# Patient Record
Sex: Male | Born: 1945 | Race: White | Hispanic: No | Marital: Married
Health system: Southern US, Community
[De-identification: ages and names within clinical notes are randomized; demographics above are authoritative.]

## PROBLEM LIST (undated history)

## (undated) DIAGNOSIS — I10 Essential (primary) hypertension: Secondary | ICD-10-CM

## (undated) DIAGNOSIS — G4730 Sleep apnea, unspecified: Secondary | ICD-10-CM

## (undated) DIAGNOSIS — H269 Unspecified cataract: Secondary | ICD-10-CM

## (undated) DIAGNOSIS — N189 Chronic kidney disease, unspecified: Secondary | ICD-10-CM

## (undated) HISTORY — DX: Chronic kidney disease, unspecified: N18.9

## (undated) HISTORY — DX: Unspecified cataract: H26.9

## (undated) HISTORY — DX: Essential (primary) hypertension: I10

## (undated) HISTORY — PX: NO PAST SURGERIES: SHX2092

---

## 2004-08-21 ENCOUNTER — Emergency Department (HOSPITAL_COMMUNITY): Admission: EM | Admit: 2004-08-21 | Discharge: 2004-08-21 | Payer: Self-pay | Admitting: Emergency Medicine

## 2006-01-29 ENCOUNTER — Encounter (HOSPITAL_COMMUNITY): Admission: RE | Admit: 2006-01-29 | Discharge: 2006-02-28 | Payer: Self-pay | Admitting: Oncology

## 2006-01-29 ENCOUNTER — Ambulatory Visit (HOSPITAL_COMMUNITY): Payer: Self-pay | Admitting: Oncology

## 2006-01-29 ENCOUNTER — Encounter: Admission: RE | Admit: 2006-01-29 | Discharge: 2006-01-29 | Payer: Self-pay | Admitting: Oncology

## 2007-02-19 ENCOUNTER — Ambulatory Visit (HOSPITAL_COMMUNITY): Admission: RE | Admit: 2007-02-19 | Discharge: 2007-02-19 | Payer: Self-pay | Admitting: Family Medicine

## 2009-02-24 ENCOUNTER — Ambulatory Visit (HOSPITAL_COMMUNITY): Admission: RE | Admit: 2009-02-24 | Discharge: 2009-02-24 | Payer: Self-pay | Admitting: Family Medicine

## 2009-02-25 ENCOUNTER — Ambulatory Visit (HOSPITAL_COMMUNITY): Admission: RE | Admit: 2009-02-25 | Discharge: 2009-02-25 | Payer: Self-pay | Admitting: Family Medicine

## 2009-07-13 ENCOUNTER — Ambulatory Visit (HOSPITAL_COMMUNITY): Admission: RE | Admit: 2009-07-13 | Discharge: 2009-07-13 | Payer: Self-pay | Admitting: Family Medicine

## 2010-05-10 ENCOUNTER — Ambulatory Visit (HOSPITAL_COMMUNITY): Admission: RE | Admit: 2010-05-10 | Discharge: 2010-05-10 | Payer: Self-pay | Admitting: Family Medicine

## 2012-04-15 ENCOUNTER — Encounter (HOSPITAL_COMMUNITY): Payer: Self-pay | Admitting: Pharmacy Technician

## 2012-04-15 NOTE — Patient Instructions (Addendum)
20 MICHAIAH HOLSOPPLE  04/15/2012   Your procedure is scheduled on:  04/22/2012  Report to [facility] at  615  AM.  Call this number if you have problems the morning of surgery: [phone]   Remember:   Do not eat food:After Midnight.  May have clear liquids:until Midnight .  Clear liquids include soda, tea, black coffee, apple or grape juice, broth.  Take these medicines the morning of surgery with A SIP OF WATER: metoprolol and cardura   Do not wear jewelry, make-up or nail polish.  Do not wear lotions, powders, or perfumes. You may wear deodorant.  Do not shave 48 hours prior to surgery.  Do not bring valuables to the hospital.  Contacts, dentures or bridgework may not be worn into surgery.  Leave suitcase in the car. After surgery it may be brought to your room.  For patients admitted to the hospital, checkout time is 11:00 AM the day of discharge.   Patients discharged the day of surgery will not be allowed to drive home.  Name and phone number of your driver: family  Special Instructions: Use eye drops as instructed by pharmacy.   Please read over the following fact sheets that you were given: Pain Booklet, Surgical Site Infection Prevention, Anesthesia Post-op Instructions and Care and Recovery After Surgery   Cataract A cataract is a clouding of the lens of the eye. When a lens becomes cloudy, vision is reduced based on the degree and nature of the clouding. Many cataracts reduce vision to some degree. Some cataracts make people more near-sighted as they develop. Other cataracts increase glare. Cataracts that are ignored and become worse can sometimes look white. The white color can be seen through the pupil. CAUSES   Aging. However, cataracts may occur at any age, even in newborns.   Certain drugs.   Trauma to the eye.   Certain diseases such as diabetes.   Specific eye diseases such as chronic inflammation inside the eye or a sudden attack of a rare form of glaucoma.    Inherited or acquired medical problems.  SYMPTOMS   Gradual, progressive drop in vision in the affected eye.   Severe, rapid visual loss. This most often happens when trauma is the cause.  DIAGNOSIS  To detect a cataract, an eye doctor examines the lens. Cataracts are best diagnosed with an exam of the eyes with the pupils enlarged (dilated) by drops.  TREATMENT  For an early cataract, vision may improve by using different eyeglasses or stronger lighting. If that does not help your vision, surgery is the only effective treatment. A cataract needs to be surgically removed when vision loss interferes with your everyday activities, such as driving, reading, or watching TV. A cataract may also have to be removed if it prevents examination or treatment of another eye problem. Surgery removes the cloudy lens and usually replaces it with a substitute lens (intraocular lens, IOL).  At a time when both you and your doctor agree, the cataract will be surgically removed. If you have cataracts in both eyes, only one is usually removed at a time. This allows the operated eye to heal and be out of danger from any possible problems after surgery (such as infection or poor wound healing). In rare cases, a cataract may be doing damage to your eye. In these cases, your caregiver may advise surgical removal right away. The vast majority of people who have cataract surgery have better vision afterward. HOME CARE INSTRUCTIONS  If you are not planning surgery, you may be asked to do the following:  Use different eyeglasses.   Use stronger or brighter lighting.   Ask your eye doctor about reducing your medicine dose or changing medicines if it is thought that a medicine caused your cataract. Changing medicines does not make the cataract go away on its own.   Become familiar with your surroundings. Poor vision can lead to injury. Avoid bumping into things on the affected side. You are at a higher risk for tripping  or falling.   Exercise extreme care when driving or operating machinery.   Wear sunglasses if you are sensitive to bright light or experiencing problems with glare.  SEEK IMMEDIATE MEDICAL CARE IF:   You have a worsening or sudden vision loss.   You notice redness, swelling, or increasing pain in the eye.   You have a fever.  Document Released: 12/11/2005 Document Revised: 11/30/2011 Document Reviewed: 08/04/2011 [facility] Patient Information 2012 [place], [place].PATIENT INSTRUCTIONS POST-ANESTHESIA  IMMEDIATELY FOLLOWING SURGERY:  Do not drive or operate machinery for the first twenty four hours after surgery.  Do not make any important decisions for twenty four hours after surgery or while taking narcotic pain medications or sedatives.  If you develop intractable nausea and vomiting or a severe headache please notify your doctor immediately.  FOLLOW-UP:  Please make an appointment with your surgeon as instructed. You do not need to follow up with anesthesia unless specifically instructed to do so.  WOUND CARE INSTRUCTIONS (if applicable):  Keep a dry clean dressing on the anesthesia/puncture wound site if there is drainage.  Once the wound has quit draining you may leave it open to air.  Generally you should leave the bandage intact for twenty four hours unless there is drainage.  If the epidural site drains for more than 36-48 hours please call the anesthesia department.  QUESTIONS?:  Please feel free to call your physician or the hospital operator if you have any questions, and they will be happy to assist you.

## 2012-04-16 ENCOUNTER — Encounter (HOSPITAL_COMMUNITY): Payer: Self-pay

## 2012-04-16 ENCOUNTER — Other Ambulatory Visit: Payer: Self-pay

## 2012-04-16 ENCOUNTER — Encounter (HOSPITAL_COMMUNITY)
Admission: RE | Admit: 2012-04-16 | Discharge: 2012-04-16 | Disposition: A | Discharge status: Home / Self Care | Payer: Medicare Other | Source: Ambulatory Visit | Attending: Ophthalmology | Admitting: Ophthalmology

## 2012-04-16 HISTORY — DX: Sleep apnea, unspecified: G47.30

## 2012-04-16 HISTORY — DX: Essential (primary) hypertension: I10

## 2012-04-16 LAB — BASIC METABOLIC PANEL WITH GFR
BUN: 23 mg/dL (ref 6–23)
CO2: 30 meq/L (ref 19–32)
Calcium: 9.5 mg/dL (ref 8.4–10.5)
Chloride: 103 meq/L (ref 96–112)
Creatinine, Ser: 1.35 mg/dL (ref 0.50–1.35)
GFR calc Af Amer: 62 mL/min — ABNORMAL LOW
GFR calc non Af Amer: 54 mL/min — ABNORMAL LOW
Glucose, Bld: 99 mg/dL (ref 70–99)
Potassium: 4.1 meq/L (ref 3.5–5.1)
Sodium: 140 meq/L (ref 135–145)

## 2012-04-16 LAB — HEMOGLOBIN AND HEMATOCRIT, BLOOD
HCT: 41.5 % (ref 39.0–52.0)
Hemoglobin: 13.8 g/dL (ref 13.0–17.0)

## 2012-04-16 MED ORDER — CYCLOPENTOLATE-PHENYLEPHRINE 0.2-1 % OP SOLN
OPHTHALMIC | Status: AC
  Filled 2012-04-16: qty 2

## 2012-04-16 NOTE — Progress Notes (Signed)
04/16/12 0951  OBSTRUCTIVE SLEEP APNEA  Score 4 or greater  Updated health history

## 2012-04-22 ENCOUNTER — Ambulatory Visit (HOSPITAL_COMMUNITY): Payer: Medicare Other | Admitting: Anesthesiology

## 2012-04-22 ENCOUNTER — Encounter (HOSPITAL_COMMUNITY): Payer: Self-pay | Admitting: Anesthesiology

## 2012-04-22 ENCOUNTER — Ambulatory Visit (HOSPITAL_COMMUNITY)
Admission: RE | Admit: 2012-04-22 | Discharge: 2012-04-22 | Disposition: A | Discharge status: Home / Self Care | Payer: Medicare Other | Source: Ambulatory Visit | Attending: Ophthalmology | Admitting: Ophthalmology

## 2012-04-22 ENCOUNTER — Encounter (HOSPITAL_COMMUNITY): Payer: Self-pay | Admitting: *Deleted

## 2012-04-22 ENCOUNTER — Encounter (HOSPITAL_COMMUNITY)
Admission: RE | Disposition: A | Discharge status: Home / Self Care | Payer: Self-pay | Source: Ambulatory Visit | Attending: Ophthalmology

## 2012-04-22 DIAGNOSIS — Z01810 Encounter for preprocedural cardiovascular examination: Secondary | ICD-10-CM | POA: Insufficient documentation

## 2012-04-22 DIAGNOSIS — Z01812 Encounter for preprocedural laboratory examination: Secondary | ICD-10-CM | POA: Insufficient documentation

## 2012-04-22 DIAGNOSIS — I10 Essential (primary) hypertension: Secondary | ICD-10-CM | POA: Insufficient documentation

## 2012-04-22 DIAGNOSIS — G4733 Obstructive sleep apnea (adult) (pediatric): Secondary | ICD-10-CM | POA: Insufficient documentation

## 2012-04-22 DIAGNOSIS — Z79899 Other long term (current) drug therapy: Secondary | ICD-10-CM | POA: Insufficient documentation

## 2012-04-22 DIAGNOSIS — H2510 Age-related nuclear cataract, unspecified eye: Secondary | ICD-10-CM | POA: Insufficient documentation

## 2012-04-22 HISTORY — PX: CATARACT EXTRACTION W/PHACO: SHX586

## 2012-04-22 SURGERY — PHACOEMULSIFICATION, CATARACT, WITH IOL INSERTION
Anesthesia: Monitor Anesthesia Care | Site: Eye | Laterality: Left | Wound class: Clean

## 2012-04-22 MED ORDER — GATIFLOXACIN 0.5 % OP SOLN OPTIME - NO CHARGE
OPHTHALMIC | Status: DC | PRN
  Administered 2012-04-22: 1 [drp] via OPHTHALMIC

## 2012-04-22 MED ORDER — TETRACAINE HCL 0.5 % OP SOLN
OPHTHALMIC | Status: AC
  Administered 2012-04-22: 1 [drp] via OPHTHALMIC
  Filled 2012-04-22: qty 2

## 2012-04-22 MED ORDER — CYCLOPENTOLATE-PHENYLEPHRINE 0.2-1 % OP SOLN
1.0000 [drp] | Freq: Once | OPHTHALMIC | Status: AC
  Administered 2012-04-22: 1 [drp] via OPHTHALMIC

## 2012-04-22 MED ORDER — BSS IO SOLN
INTRAOCULAR | Status: DC | PRN
  Administered 2012-04-22: 15 mL via INTRAOCULAR

## 2012-04-22 MED ORDER — GATIFLOXACIN 0.5 % OP SOLN
1.0000 [drp] | Freq: Once | OPHTHALMIC | Status: AC
  Administered 2012-04-22: 1 [drp] via OPHTHALMIC

## 2012-04-22 MED ORDER — TETRACAINE 0.5 % OP SOLN OPTIME - NO CHARGE
OPHTHALMIC | Status: DC | PRN
  Administered 2012-04-22: 1 [drp] via OPHTHALMIC

## 2012-04-22 MED ORDER — EPINEPHRINE HCL 1 MG/ML IJ SOLN
INTRAOCULAR | Status: DC | PRN
  Administered 2012-04-22: 08:00:00

## 2012-04-22 MED ORDER — LIDOCAINE HCL 3.5 % OP GEL
OPHTHALMIC | Status: AC
  Filled 2012-04-22: qty 5

## 2012-04-22 MED ORDER — MIDAZOLAM HCL 2 MG/2ML IJ SOLN
INTRAMUSCULAR | Status: AC
  Administered 2012-04-22: 1 mg via INTRAVENOUS
  Filled 2012-04-22: qty 2

## 2012-04-22 MED ORDER — TRYPAN BLUE 0.06 % OP SOLN
OPHTHALMIC | Status: AC
  Filled 2012-04-22: qty 0.5

## 2012-04-22 MED ORDER — TETRACAINE HCL 0.5 % OP SOLN
1.0000 [drp] | Freq: Once | OPHTHALMIC | Status: AC
  Administered 2012-04-22: 1 [drp] via OPHTHALMIC

## 2012-04-22 MED ORDER — EPINEPHRINE HCL 1 MG/ML IJ SOLN
INTRAMUSCULAR | Status: AC
  Filled 2012-04-22: qty 3

## 2012-04-22 MED ORDER — KETOROLAC TROMETHAMINE 0.5 % OP SOLN
1.0000 [drp] | Freq: Once | OPHTHALMIC | Status: AC
  Administered 2012-04-22: 1 [drp] via OPHTHALMIC

## 2012-04-22 MED ORDER — CARBACHOL 0.01 % IO SOLN
INTRAOCULAR | Status: AC
  Filled 2012-04-22: qty 1.5

## 2012-04-22 MED ORDER — GLYCOPYRROLATE 0.2 MG/ML IJ SOLN
INTRAMUSCULAR | Status: AC
  Filled 2012-04-22: qty 1

## 2012-04-22 MED ORDER — GLYCOPYRROLATE 0.2 MG/ML IJ SOLN
INTRAMUSCULAR | Status: DC | PRN
  Administered 2012-04-22: 0.2 mg via INTRAVENOUS

## 2012-04-22 MED ORDER — LACTATED RINGERS IV SOLN: INTRAVENOUS | Status: DC

## 2012-04-22 MED ORDER — LACTATED RINGERS IV SOLN
INTRAVENOUS | Status: DC | PRN
  Administered 2012-04-22: 06:00:00 via INTRAVENOUS
  Administered 2012-04-22: 1000 mL

## 2012-04-22 MED ORDER — NA HYALUR & NA CHOND-NA HYALUR 0.55-0.5 ML IO KIT
PACK | INTRAOCULAR | Status: DC | PRN
  Administered 2012-04-22: 1 via OPHTHALMIC

## 2012-04-22 MED ORDER — LIDOCAINE 3.5 % OP GEL OPTIME - NO CHARGE
OPHTHALMIC | Status: DC | PRN
  Administered 2012-04-22: 1 [drp] via OPHTHALMIC

## 2012-04-22 MED ORDER — MIDAZOLAM HCL 2 MG/2ML IJ SOLN
1.0000 mg | INTRAMUSCULAR | Status: DC | PRN
  Administered 2012-04-22 (×2): 1 mg via INTRAVENOUS

## 2012-04-22 SURGICAL SUPPLY — 27 items
CAPSULAR TENSION RING-AMO (OPHTHALMIC RELATED) IMPLANT
CLOTH BEACON ORANGE TIMEOUT ST (SAFETY) ×1 IMPLANT
GLOVE BIO SURGEON STRL SZ7.5 (GLOVE) IMPLANT
GLOVE BIOGEL M 6.5 STRL (GLOVE) IMPLANT
GLOVE BIOGEL PI IND STRL 6.5 (GLOVE) IMPLANT
GLOVE BIOGEL PI IND STRL 7.0 (GLOVE) IMPLANT
GLOVE BIOGEL PI INDICATOR 6.5 (GLOVE)
GLOVE BIOGEL PI INDICATOR 7.0 (GLOVE) ×1
GLOVE ECLIPSE 6.5 STRL STRAW (GLOVE) IMPLANT
GLOVE ECLIPSE 7.5 STRL STRAW (GLOVE) IMPLANT
GLOVE EXAM NITRILE LRG STRL (GLOVE) IMPLANT
GLOVE EXAM NITRILE MD LF STRL (GLOVE) ×1 IMPLANT
GLOVE SKINSENSE NS SZ6.5 (GLOVE)
GLOVE SKINSENSE NS SZ7.0 (GLOVE)
GLOVE SKINSENSE STRL SZ6.5 (GLOVE) IMPLANT
GLOVE SKINSENSE STRL SZ7.0 (GLOVE) IMPLANT
INST SET CATARACT ~~LOC~~ (KITS) ×2 IMPLANT
KIT VITRECTOMY (OPHTHALMIC RELATED) IMPLANT
PAD ARMBOARD 7.5X6 YLW CONV (MISCELLANEOUS) ×1 IMPLANT
PROC W NO LENS (INTRAOCULAR LENS)
PROC W SPEC LENS (INTRAOCULAR LENS)
PROCESS W NO LENS (INTRAOCULAR LENS) IMPLANT
PROCESS W SPEC LENS (INTRAOCULAR LENS) IMPLANT
RING MALYGIN (MISCELLANEOUS) IMPLANT
SIGHTPATH CAT PROC W REG LENS (Ophthalmic Related) ×2 IMPLANT
VISCOELASTIC ADDITIONAL (OPHTHALMIC RELATED) IMPLANT
WATER STERILE IRR 250ML POUR (IV SOLUTION) ×1 IMPLANT

## 2012-04-22 NOTE — Preoperative (Signed)
Beta Blockers   Reason not to administer Beta Blockers:Not Applicable 

## 2012-04-22 NOTE — Anesthesia Preprocedure Evaluation (Signed)
Anesthesia Evaluation  Patient identified by MRN, date of birth, ID band Patient awake    Reviewed: Allergy & Precautions, H&P , NPO status , Patient's Chart, lab work & pertinent test results, reviewed documented beta blocker date and time   History of Anesthesia Complications Negative for: history of anesthetic complications  Airway Mallampati: II      Dental  (+) Teeth Intact   Pulmonary sleep apnea ,  breath sounds clear to auscultation        Cardiovascular hypertension, Pt. on medications Rhythm:Regular Rate:Normal     Neuro/Psych    GI/Hepatic   Endo/Other    Renal/GU      Musculoskeletal   Abdominal   Peds  Hematology   Anesthesia Other Findings   Reproductive/Obstetrics                           Anesthesia Physical Anesthesia Plan  ASA: II  Anesthesia Plan: MAC   Post-op Pain Management:    Induction: Intravenous  Airway Management Planned: Nasal Cannula  Additional Equipment:   Intra-op Plan:   Post-operative Plan:   Informed Consent: I have reviewed the patients History and Physical, chart, labs and discussed the procedure including the risks, benefits and alternatives for the proposed anesthesia with the patient or authorized representative who has indicated his/her understanding and acceptance.     Plan Discussed with:   Anesthesia Plan Comments:         Anesthesia Quick Evaluation

## 2012-04-22 NOTE — Op Note (Signed)
See scanned formal op note done today on another system

## 2012-04-22 NOTE — Anesthesia Postprocedure Evaluation (Signed)
  Anesthesia Post-op Note  Patient: John Ochoa  Procedure(s) Performed: Procedure(s) (LRB): CATARACT EXTRACTION PHACO AND INTRAOCULAR LENS PLACEMENT (IOC) (Left)  Patient Location: PACU  Anesthesia Type: MAC  Level of Consciousness: awake, alert , oriented and patient cooperative  Airway and Oxygen Therapy: Patient Spontanous Breathing  Post-op Pain: none  Post-op Assessment: Post-op Vital signs reviewed, Patient's Cardiovascular Status Stable and Respiratory Function Stable  Post-op Vital Signs: Reviewed and stable  Complications: No apparent anesthesia complications

## 2012-04-22 NOTE — Anesthesia Procedure Notes (Signed)
Procedure Name: MAC Date/Time: 04/22/2012 7:43 AM Performed by: Carolyne Littles, Huntington Leverich L Oxygen Delivery Method: Nasal cannula

## 2012-04-22 NOTE — Brief Op Note (Signed)
04/22/2012  10:42 AM  PATIENT:  Jacinto Reap  66 y.o. male  PRE-OPERATIVE DIAGNOSIS:  nuclear cataract left eye  POST-OPERATIVE DIAGNOSIS:  nuclear cataract left eye  PROCEDURE:  Procedure(s): CATARACT EXTRACTION PHACO AND INTRAOCULAR LENS PLACEMENT (IOC)  SURGEON:  Surgeon(s): Susa Simmonds, MD  ASSISTANTS:  Valda Lamb, CST  ANESTHESIA STAFF: Marolyn Hammock, CRNA - CRNA Laurene Footman, MD - Anesthesiologist  ANESTHESIA:   topical and MAC  REQUESTED LENS POWER: 24.0  LENS IMPLANT INFORMATION:  Alcon SN60 WF s/n [id number]  Exp 07/2015  CUMULATIVE DISSIPATED ENERGY:40.43  INDICATIONS:see pre op H&P  OP FINDINGS:dense NS  COMPLICATIONS:None  DICTATION #: see scanned op note  PLAN OF CARE: per pt instructions  PATIENT DISPOSITION:  Short Stay

## 2012-04-22 NOTE — Transfer of Care (Signed)
Immediate Anesthesia Transfer of Care Note  Patient: John Ochoa  Procedure(s) Performed: Procedure(s) (LRB): CATARACT EXTRACTION PHACO AND INTRAOCULAR LENS PLACEMENT (IOC) (Left)  Patient Location: PACU  Anesthesia Type: MAC  Level of Consciousness: awake, alert , oriented and patient cooperative  Airway & Oxygen Therapy: Patient Spontanous Breathing  Post-op Assessment: Report given to PACU RN and Post -op Vital signs reviewed and stable  Post vital signs: Reviewed and stable  Complications: No apparent anesthesia complications

## 2012-04-22 NOTE — H&P (Signed)
I have reviewed the pre printed H&P, the patient was re-examined, and I have identified no significant interval changes in the patient's medical condition.  There is no change in the plan of care since the history and physical of record. 

## 2012-04-23 ENCOUNTER — Encounter (HOSPITAL_COMMUNITY): Payer: Self-pay | Admitting: Ophthalmology

## 2012-05-31 ENCOUNTER — Ambulatory Visit (HOSPITAL_COMMUNITY)
Admission: RE | Admit: 2012-05-31 | Discharge: 2012-05-31 | Disposition: A | Discharge status: Home / Self Care | Payer: Medicare Other | Source: Ambulatory Visit | Attending: Family Medicine | Admitting: Family Medicine

## 2012-05-31 ENCOUNTER — Other Ambulatory Visit (HOSPITAL_COMMUNITY): Payer: Self-pay | Admitting: Family Medicine

## 2012-05-31 DIAGNOSIS — I10 Essential (primary) hypertension: Secondary | ICD-10-CM

## 2012-05-31 DIAGNOSIS — Z0000 Encounter for general adult medical examination without abnormal findings: Secondary | ICD-10-CM

## 2013-08-15 ENCOUNTER — Other Ambulatory Visit (HOSPITAL_COMMUNITY): Payer: Self-pay | Admitting: Family Medicine

## 2013-08-15 ENCOUNTER — Ambulatory Visit (HOSPITAL_COMMUNITY)
Admission: RE | Admit: 2013-08-15 | Discharge: 2013-08-15 | Disposition: A | Discharge status: Home / Self Care | Payer: Medicare Other | Source: Ambulatory Visit | Attending: Family Medicine | Admitting: Family Medicine

## 2013-08-15 DIAGNOSIS — I10 Essential (primary) hypertension: Secondary | ICD-10-CM | POA: Insufficient documentation

## 2015-01-18 DIAGNOSIS — N529 Male erectile dysfunction, unspecified: Secondary | ICD-10-CM | POA: Diagnosis not present

## 2015-01-18 DIAGNOSIS — Z6823 Body mass index (BMI) 23.0-23.9, adult: Secondary | ICD-10-CM | POA: Diagnosis not present

## 2015-01-18 DIAGNOSIS — I10 Essential (primary) hypertension: Secondary | ICD-10-CM | POA: Diagnosis not present

## 2015-05-31 DIAGNOSIS — Z1283 Encounter for screening for malignant neoplasm of skin: Secondary | ICD-10-CM | POA: Diagnosis not present

## 2015-05-31 DIAGNOSIS — Z08 Encounter for follow-up examination after completed treatment for malignant neoplasm: Secondary | ICD-10-CM | POA: Diagnosis not present

## 2015-05-31 DIAGNOSIS — Z85820 Personal history of malignant melanoma of skin: Secondary | ICD-10-CM | POA: Diagnosis not present

## 2015-05-31 DIAGNOSIS — L259 Unspecified contact dermatitis, unspecified cause: Secondary | ICD-10-CM | POA: Diagnosis not present

## 2015-05-31 DIAGNOSIS — L570 Actinic keratosis: Secondary | ICD-10-CM | POA: Diagnosis not present

## 2015-07-05 DIAGNOSIS — I10 Essential (primary) hypertension: Secondary | ICD-10-CM | POA: Diagnosis not present

## 2015-07-08 DIAGNOSIS — Z0000 Encounter for general adult medical examination without abnormal findings: Secondary | ICD-10-CM | POA: Diagnosis not present

## 2015-07-08 DIAGNOSIS — Z1389 Encounter for screening for other disorder: Secondary | ICD-10-CM | POA: Diagnosis not present

## 2015-07-08 DIAGNOSIS — I10 Essential (primary) hypertension: Secondary | ICD-10-CM | POA: Diagnosis not present

## 2015-07-08 DIAGNOSIS — Z6823 Body mass index (BMI) 23.0-23.9, adult: Secondary | ICD-10-CM | POA: Diagnosis not present

## 2015-07-09 DIAGNOSIS — Z6823 Body mass index (BMI) 23.0-23.9, adult: Secondary | ICD-10-CM | POA: Diagnosis not present

## 2015-07-09 DIAGNOSIS — Z1389 Encounter for screening for other disorder: Secondary | ICD-10-CM | POA: Diagnosis not present

## 2015-07-09 DIAGNOSIS — D696 Thrombocytopenia, unspecified: Secondary | ICD-10-CM | POA: Diagnosis not present

## 2015-08-13 DIAGNOSIS — E039 Hypothyroidism, unspecified: Secondary | ICD-10-CM | POA: Diagnosis not present

## 2015-11-02 DIAGNOSIS — F411 Generalized anxiety disorder: Secondary | ICD-10-CM | POA: Diagnosis not present

## 2015-11-02 DIAGNOSIS — Z23 Encounter for immunization: Secondary | ICD-10-CM | POA: Diagnosis not present

## 2015-11-02 DIAGNOSIS — I10 Essential (primary) hypertension: Secondary | ICD-10-CM | POA: Diagnosis not present

## 2015-11-02 DIAGNOSIS — E039 Hypothyroidism, unspecified: Secondary | ICD-10-CM | POA: Diagnosis not present

## 2015-11-03 DIAGNOSIS — R946 Abnormal results of thyroid function studies: Secondary | ICD-10-CM | POA: Diagnosis not present

## 2015-11-03 DIAGNOSIS — E782 Mixed hyperlipidemia: Secondary | ICD-10-CM | POA: Diagnosis not present

## 2015-11-03 DIAGNOSIS — Z125 Encounter for screening for malignant neoplasm of prostate: Secondary | ICD-10-CM | POA: Diagnosis not present

## 2015-11-04 DIAGNOSIS — D696 Thrombocytopenia, unspecified: Secondary | ICD-10-CM | POA: Diagnosis not present

## 2015-11-04 DIAGNOSIS — E039 Hypothyroidism, unspecified: Secondary | ICD-10-CM | POA: Diagnosis not present

## 2015-11-04 DIAGNOSIS — F411 Generalized anxiety disorder: Secondary | ICD-10-CM | POA: Diagnosis not present

## 2015-11-04 DIAGNOSIS — I10 Essential (primary) hypertension: Secondary | ICD-10-CM | POA: Diagnosis not present

## 2016-05-01 DIAGNOSIS — E039 Hypothyroidism, unspecified: Secondary | ICD-10-CM | POA: Diagnosis not present

## 2016-05-01 DIAGNOSIS — E782 Mixed hyperlipidemia: Secondary | ICD-10-CM | POA: Diagnosis not present

## 2016-05-04 DIAGNOSIS — E039 Hypothyroidism, unspecified: Secondary | ICD-10-CM | POA: Diagnosis not present

## 2016-05-04 DIAGNOSIS — D696 Thrombocytopenia, unspecified: Secondary | ICD-10-CM | POA: Diagnosis not present

## 2016-05-04 DIAGNOSIS — I10 Essential (primary) hypertension: Secondary | ICD-10-CM | POA: Diagnosis not present

## 2016-05-04 DIAGNOSIS — E441 Mild protein-calorie malnutrition: Secondary | ICD-10-CM | POA: Diagnosis not present

## 2016-05-08 DIAGNOSIS — L570 Actinic keratosis: Secondary | ICD-10-CM | POA: Diagnosis not present

## 2016-05-08 DIAGNOSIS — Z08 Encounter for follow-up examination after completed treatment for malignant neoplasm: Secondary | ICD-10-CM | POA: Diagnosis not present

## 2016-05-08 DIAGNOSIS — Z1283 Encounter for screening for malignant neoplasm of skin: Secondary | ICD-10-CM | POA: Diagnosis not present

## 2016-05-08 DIAGNOSIS — X32XXXD Exposure to sunlight, subsequent encounter: Secondary | ICD-10-CM | POA: Diagnosis not present

## 2016-05-08 DIAGNOSIS — Z85820 Personal history of malignant melanoma of skin: Secondary | ICD-10-CM | POA: Diagnosis not present

## 2016-10-02 DIAGNOSIS — J019 Acute sinusitis, unspecified: Secondary | ICD-10-CM | POA: Diagnosis not present

## 2016-10-02 DIAGNOSIS — R05 Cough: Secondary | ICD-10-CM | POA: Diagnosis not present

## 2016-10-02 DIAGNOSIS — Z6822 Body mass index (BMI) 22.0-22.9, adult: Secondary | ICD-10-CM | POA: Diagnosis not present

## 2016-10-13 DIAGNOSIS — Z23 Encounter for immunization: Secondary | ICD-10-CM | POA: Diagnosis not present

## 2016-10-30 DIAGNOSIS — E039 Hypothyroidism, unspecified: Secondary | ICD-10-CM | POA: Diagnosis not present

## 2016-10-30 DIAGNOSIS — E782 Mixed hyperlipidemia: Secondary | ICD-10-CM | POA: Diagnosis not present

## 2016-11-09 DIAGNOSIS — E039 Hypothyroidism, unspecified: Secondary | ICD-10-CM | POA: Diagnosis not present

## 2016-11-09 DIAGNOSIS — Z0000 Encounter for general adult medical examination without abnormal findings: Secondary | ICD-10-CM | POA: Diagnosis not present

## 2016-11-09 DIAGNOSIS — E782 Mixed hyperlipidemia: Secondary | ICD-10-CM | POA: Diagnosis not present

## 2016-11-09 DIAGNOSIS — I10 Essential (primary) hypertension: Secondary | ICD-10-CM | POA: Diagnosis not present

## 2016-11-09 DIAGNOSIS — D696 Thrombocytopenia, unspecified: Secondary | ICD-10-CM | POA: Diagnosis not present

## 2016-12-04 DIAGNOSIS — Z6823 Body mass index (BMI) 23.0-23.9, adult: Secondary | ICD-10-CM | POA: Diagnosis not present

## 2016-12-04 DIAGNOSIS — I10 Essential (primary) hypertension: Secondary | ICD-10-CM | POA: Diagnosis not present

## 2016-12-21 DIAGNOSIS — I10 Essential (primary) hypertension: Secondary | ICD-10-CM | POA: Diagnosis not present

## 2017-04-12 DIAGNOSIS — E782 Mixed hyperlipidemia: Secondary | ICD-10-CM | POA: Diagnosis not present

## 2017-04-12 DIAGNOSIS — I10 Essential (primary) hypertension: Secondary | ICD-10-CM | POA: Diagnosis not present

## 2017-04-12 DIAGNOSIS — E039 Hypothyroidism, unspecified: Secondary | ICD-10-CM | POA: Diagnosis not present

## 2017-04-16 DIAGNOSIS — D696 Thrombocytopenia, unspecified: Secondary | ICD-10-CM | POA: Diagnosis not present

## 2017-04-16 DIAGNOSIS — R944 Abnormal results of kidney function studies: Secondary | ICD-10-CM | POA: Diagnosis not present

## 2017-04-16 DIAGNOSIS — I10 Essential (primary) hypertension: Secondary | ICD-10-CM | POA: Diagnosis not present

## 2017-04-16 DIAGNOSIS — E039 Hypothyroidism, unspecified: Secondary | ICD-10-CM | POA: Diagnosis not present

## 2017-04-16 DIAGNOSIS — D509 Iron deficiency anemia, unspecified: Secondary | ICD-10-CM | POA: Diagnosis not present

## 2017-04-17 ENCOUNTER — Other Ambulatory Visit (HOSPITAL_COMMUNITY): Payer: Self-pay | Admitting: Internal Medicine

## 2017-04-17 DIAGNOSIS — R748 Abnormal levels of other serum enzymes: Secondary | ICD-10-CM

## 2017-04-23 ENCOUNTER — Ambulatory Visit (HOSPITAL_COMMUNITY)
Admission: RE | Admit: 2017-04-23 | Discharge: 2017-04-23 | Disposition: A | Discharge status: Home / Self Care | Payer: Medicare Other | Source: Ambulatory Visit | Attending: Internal Medicine | Admitting: Internal Medicine

## 2017-04-23 DIAGNOSIS — N200 Calculus of kidney: Secondary | ICD-10-CM | POA: Insufficient documentation

## 2017-04-23 DIAGNOSIS — R7989 Other specified abnormal findings of blood chemistry: Secondary | ICD-10-CM | POA: Insufficient documentation

## 2017-04-23 DIAGNOSIS — N3289 Other specified disorders of bladder: Secondary | ICD-10-CM | POA: Diagnosis not present

## 2017-04-23 DIAGNOSIS — R748 Abnormal levels of other serum enzymes: Secondary | ICD-10-CM

## 2017-05-27 IMAGING — US US RENAL
1 series · 14 of 25 positions shown · non-contrast
Comparison: Abdominal and pelvic CT scan August 21, 2004

CLINICAL DATA: History of kidney stones, abnormal creatinine Prencisa
level.

EXAM:
RENAL / URINARY TRACT ULTRASOUND COMPLETE

[Series 1: us renal · 14 of 54 slices shown]
[im 1/54]
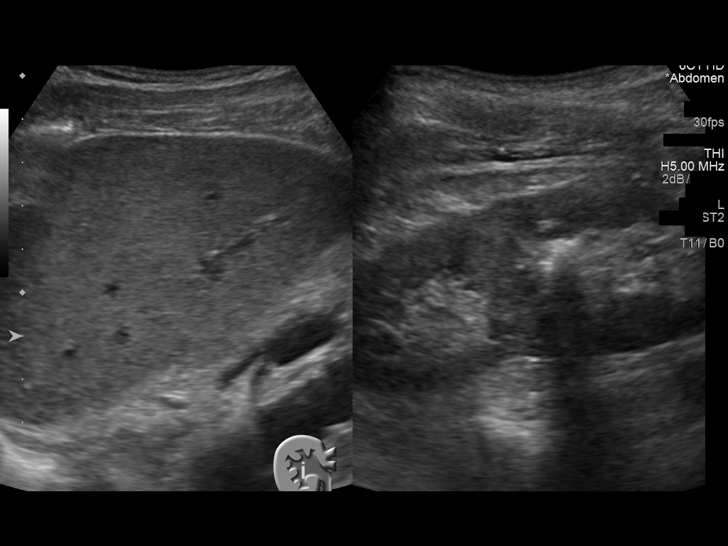
[im 5/54]
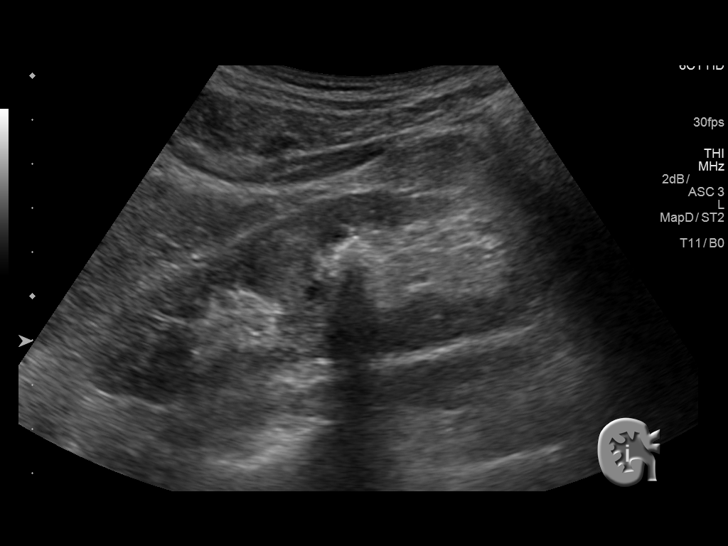
[im 9/54]
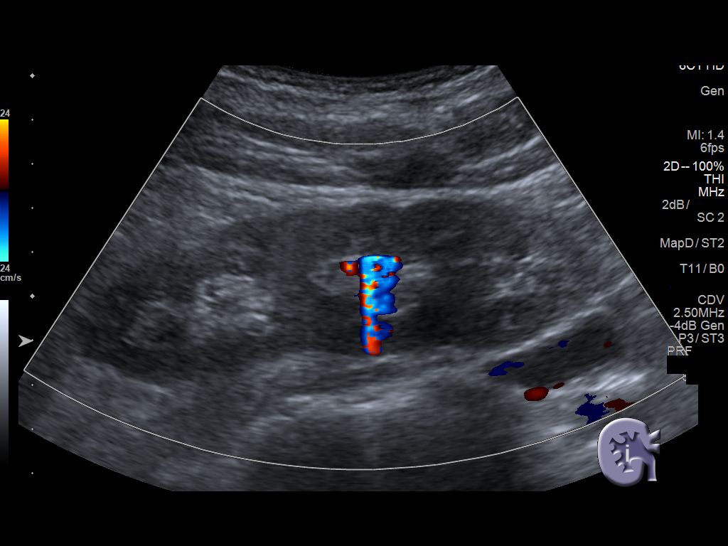
[im 14/54]
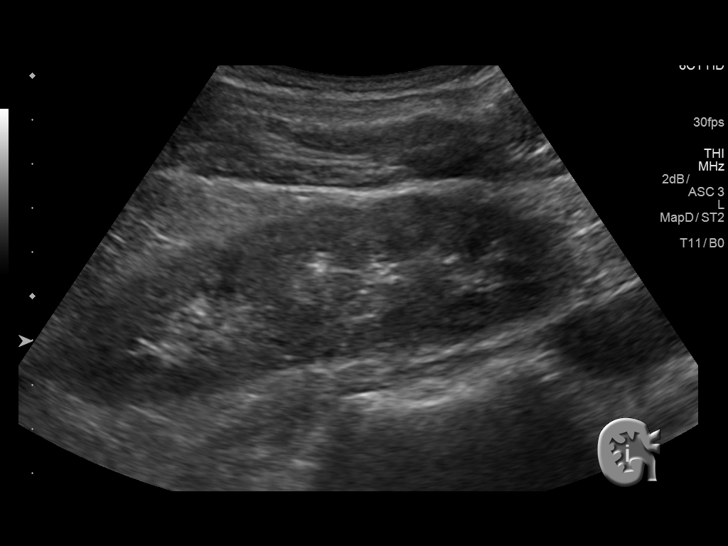
[im 18/54]
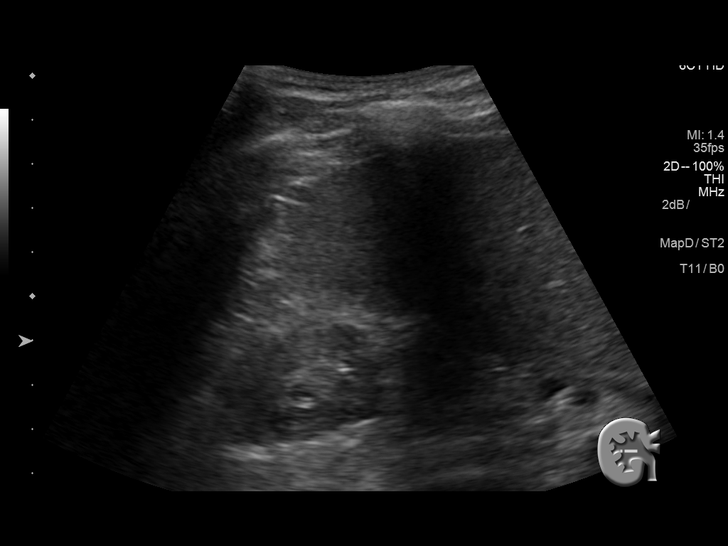
[im 20/54]
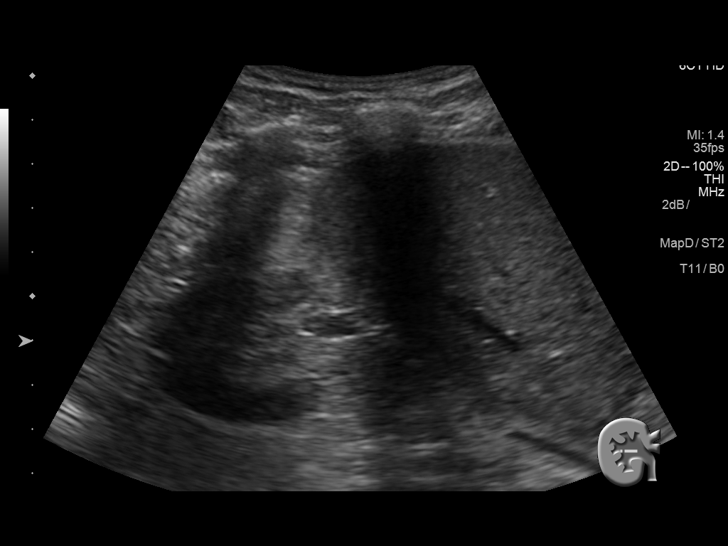
[im 25/54]
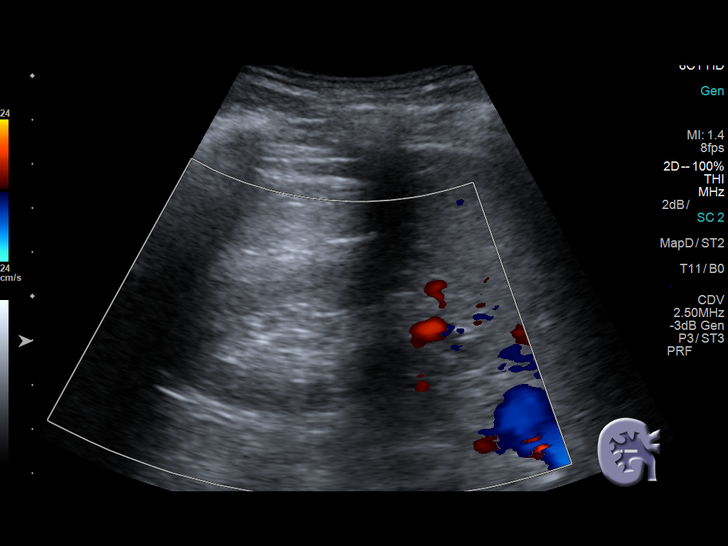
[im 29/54]
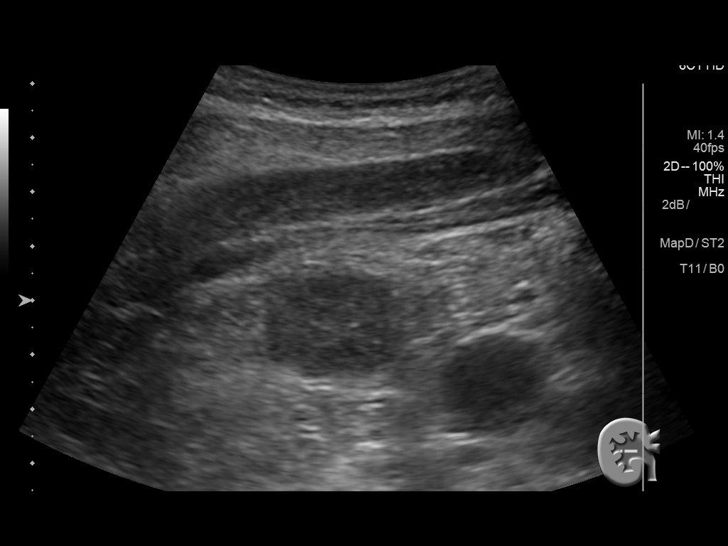
[im 34/54]
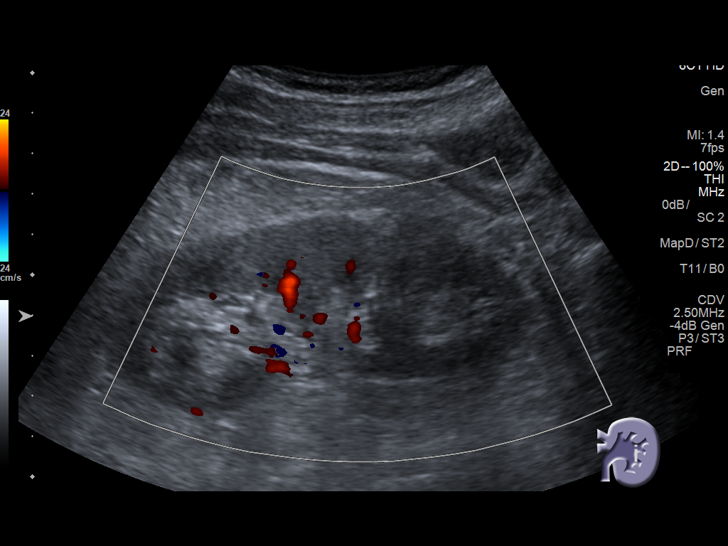
[im 36/54]
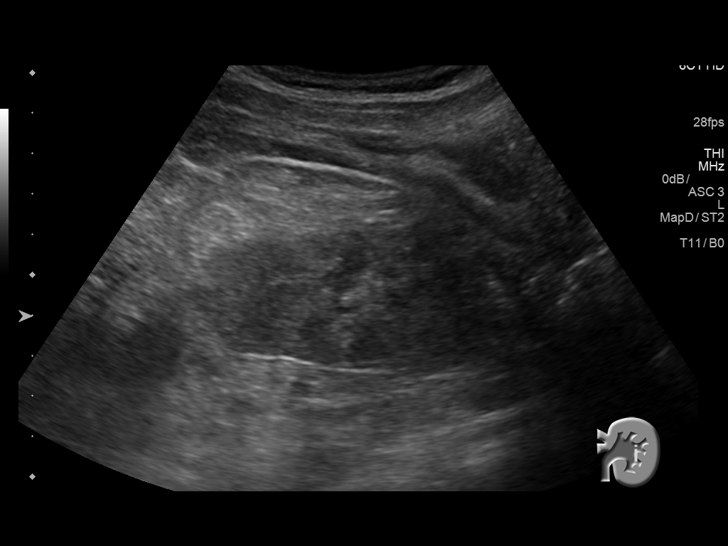
[im 40/54]
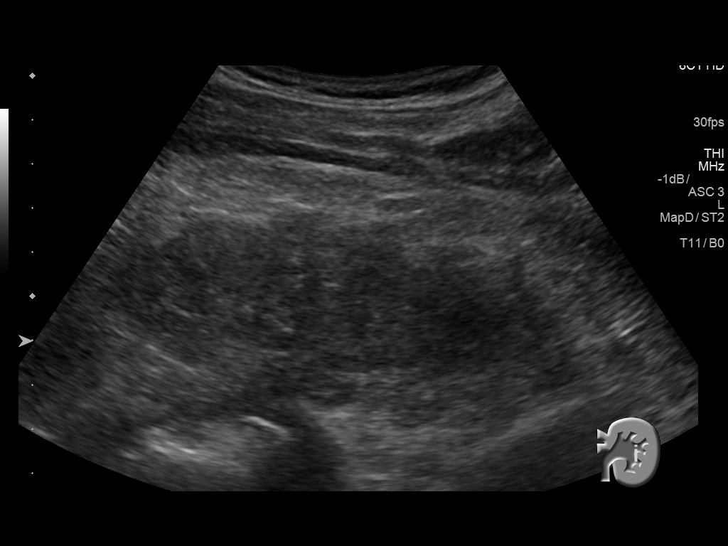
[im 45/54]
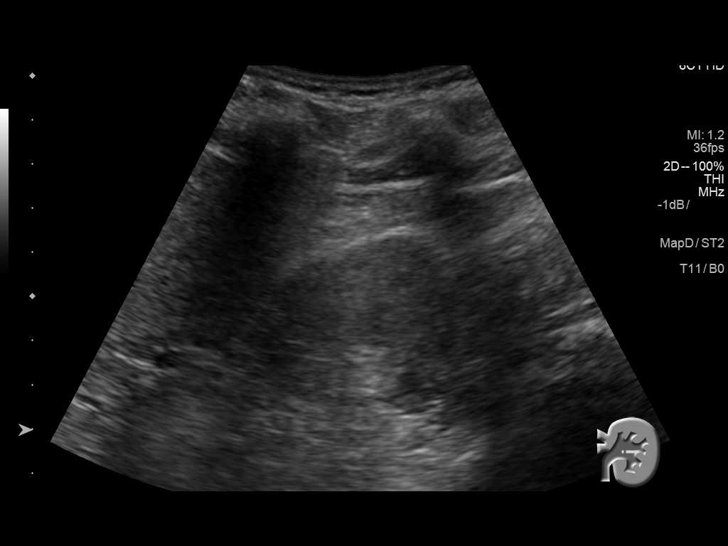
[im 49/54]
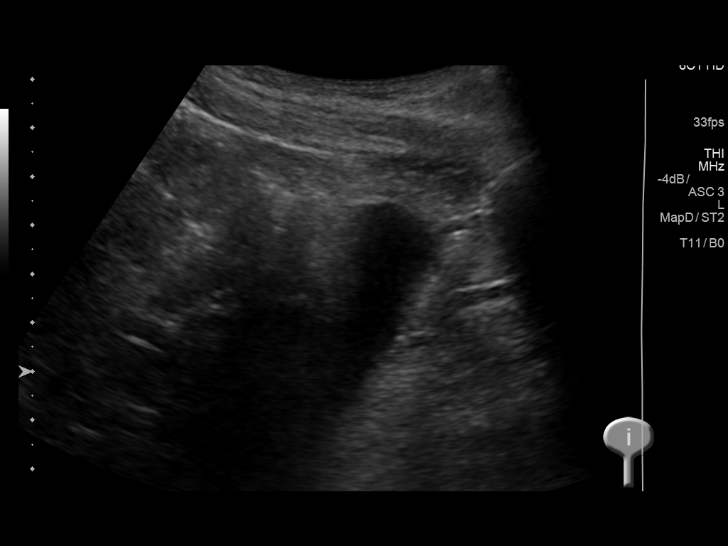
[im 54/54]
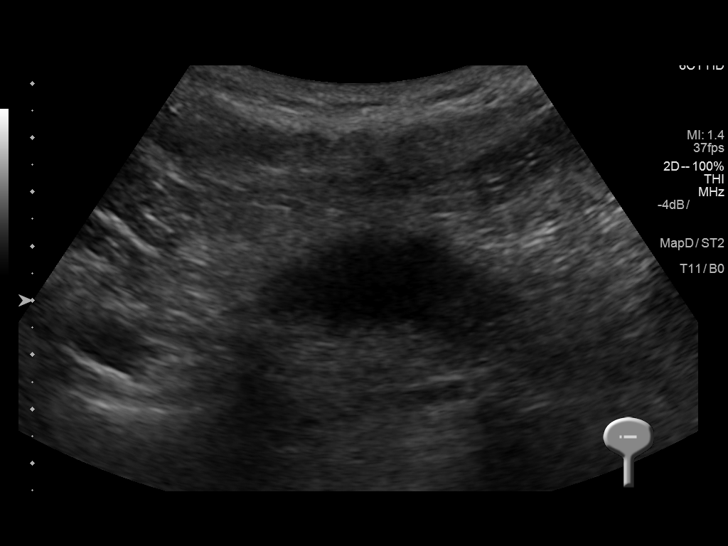

[14 of 25 positions shown; findings below may reference images not displayed]

FINDINGS: Right Kidney:

Length: 12.1 cm. The renal cortical echotexture remains lower than
that of the right kidney. A known duplicated right renal collecting
system is observed. A stone in the lower pole moiety measures 12 mm
in diameter. There is no evidence of obstruction currently.

Left Kidney:

Length: 10.9 cm. Echogenicity within normal limits. No mass or
hydronephrosis visualized.

Bladder:

The partially distended urinary bladder is grossly normal.
IMPRESSION: 12 mm nonobstructing stone in the lower pole moiety of the
duplicated right renal collecting system. No pelvocaliectasis of the
upper pole moiety either.

Normal appearance of the left kidney.

The urinary bladder is partially distended and grossly normal.

## 2017-05-28 DIAGNOSIS — R944 Abnormal results of kidney function studies: Secondary | ICD-10-CM | POA: Diagnosis not present

## 2017-05-28 DIAGNOSIS — Z0000 Encounter for general adult medical examination without abnormal findings: Secondary | ICD-10-CM | POA: Diagnosis not present

## 2017-05-28 DIAGNOSIS — Z6822 Body mass index (BMI) 22.0-22.9, adult: Secondary | ICD-10-CM | POA: Diagnosis not present

## 2017-05-28 DIAGNOSIS — I10 Essential (primary) hypertension: Secondary | ICD-10-CM | POA: Diagnosis not present

## 2017-05-28 DIAGNOSIS — D649 Anemia, unspecified: Secondary | ICD-10-CM | POA: Diagnosis not present

## 2017-05-28 DIAGNOSIS — E039 Hypothyroidism, unspecified: Secondary | ICD-10-CM | POA: Diagnosis not present

## 2017-07-18 DIAGNOSIS — D509 Iron deficiency anemia, unspecified: Secondary | ICD-10-CM | POA: Diagnosis not present

## 2017-07-18 DIAGNOSIS — R944 Abnormal results of kidney function studies: Secondary | ICD-10-CM | POA: Diagnosis not present

## 2017-07-18 DIAGNOSIS — I10 Essential (primary) hypertension: Secondary | ICD-10-CM | POA: Diagnosis not present

## 2017-07-24 DIAGNOSIS — N200 Calculus of kidney: Secondary | ICD-10-CM | POA: Diagnosis not present

## 2017-07-24 DIAGNOSIS — D649 Anemia, unspecified: Secondary | ICD-10-CM | POA: Diagnosis not present

## 2017-07-24 DIAGNOSIS — I10 Essential (primary) hypertension: Secondary | ICD-10-CM | POA: Diagnosis not present

## 2017-07-24 DIAGNOSIS — N183 Chronic kidney disease, stage 3 (moderate): Secondary | ICD-10-CM | POA: Diagnosis not present

## 2017-08-08 DIAGNOSIS — E559 Vitamin D deficiency, unspecified: Secondary | ICD-10-CM | POA: Diagnosis not present

## 2017-08-08 DIAGNOSIS — D509 Iron deficiency anemia, unspecified: Secondary | ICD-10-CM | POA: Diagnosis not present

## 2017-08-08 DIAGNOSIS — Z79899 Other long term (current) drug therapy: Secondary | ICD-10-CM | POA: Diagnosis not present

## 2017-08-08 DIAGNOSIS — N183 Chronic kidney disease, stage 3 (moderate): Secondary | ICD-10-CM | POA: Diagnosis not present

## 2017-08-08 DIAGNOSIS — Z1159 Encounter for screening for other viral diseases: Secondary | ICD-10-CM | POA: Diagnosis not present

## 2017-08-08 DIAGNOSIS — I10 Essential (primary) hypertension: Secondary | ICD-10-CM | POA: Diagnosis not present

## 2017-08-08 DIAGNOSIS — R809 Proteinuria, unspecified: Secondary | ICD-10-CM | POA: Diagnosis not present

## 2017-08-14 ENCOUNTER — Other Ambulatory Visit (HOSPITAL_COMMUNITY): Payer: Self-pay | Admitting: Nephrology

## 2017-08-14 DIAGNOSIS — N183 Chronic kidney disease, stage 3 unspecified: Secondary | ICD-10-CM

## 2017-08-15 ENCOUNTER — Ambulatory Visit (HOSPITAL_COMMUNITY)
Admission: RE | Admit: 2017-08-15 | Discharge: 2017-08-15 | Disposition: A | Discharge status: Home / Self Care | Payer: Medicare Other | Source: Ambulatory Visit | Attending: Vascular Surgery | Admitting: Vascular Surgery

## 2017-08-15 DIAGNOSIS — N183 Chronic kidney disease, stage 3 unspecified: Secondary | ICD-10-CM

## 2017-08-16 ENCOUNTER — Encounter: Payer: Self-pay | Admitting: Internal Medicine

## 2017-09-04 DIAGNOSIS — D649 Anemia, unspecified: Secondary | ICD-10-CM | POA: Diagnosis not present

## 2017-09-04 DIAGNOSIS — I10 Essential (primary) hypertension: Secondary | ICD-10-CM | POA: Diagnosis not present

## 2017-09-04 DIAGNOSIS — N183 Chronic kidney disease, stage 3 (moderate): Secondary | ICD-10-CM | POA: Diagnosis not present

## 2017-09-04 DIAGNOSIS — N200 Calculus of kidney: Secondary | ICD-10-CM | POA: Diagnosis not present

## 2017-09-12 DIAGNOSIS — Z23 Encounter for immunization: Secondary | ICD-10-CM | POA: Diagnosis not present

## 2017-10-16 DIAGNOSIS — E039 Hypothyroidism, unspecified: Secondary | ICD-10-CM | POA: Diagnosis not present

## 2017-10-16 DIAGNOSIS — I10 Essential (primary) hypertension: Secondary | ICD-10-CM | POA: Diagnosis not present

## 2017-10-16 DIAGNOSIS — D509 Iron deficiency anemia, unspecified: Secondary | ICD-10-CM | POA: Diagnosis not present

## 2017-10-18 DIAGNOSIS — D696 Thrombocytopenia, unspecified: Secondary | ICD-10-CM | POA: Diagnosis not present

## 2017-10-18 DIAGNOSIS — I10 Essential (primary) hypertension: Secondary | ICD-10-CM | POA: Diagnosis not present

## 2017-11-14 DIAGNOSIS — I10 Essential (primary) hypertension: Secondary | ICD-10-CM | POA: Diagnosis not present

## 2017-11-14 DIAGNOSIS — E782 Mixed hyperlipidemia: Secondary | ICD-10-CM | POA: Diagnosis not present

## 2017-11-14 DIAGNOSIS — E039 Hypothyroidism, unspecified: Secondary | ICD-10-CM | POA: Diagnosis not present

## 2017-11-20 DIAGNOSIS — R001 Bradycardia, unspecified: Secondary | ICD-10-CM | POA: Diagnosis not present

## 2017-11-20 DIAGNOSIS — I10 Essential (primary) hypertension: Secondary | ICD-10-CM | POA: Diagnosis not present

## 2018-01-09 DIAGNOSIS — R944 Abnormal results of kidney function studies: Secondary | ICD-10-CM | POA: Diagnosis not present

## 2018-01-09 DIAGNOSIS — E039 Hypothyroidism, unspecified: Secondary | ICD-10-CM | POA: Diagnosis not present

## 2018-01-09 DIAGNOSIS — I10 Essential (primary) hypertension: Secondary | ICD-10-CM | POA: Diagnosis not present

## 2018-01-09 DIAGNOSIS — R001 Bradycardia, unspecified: Secondary | ICD-10-CM | POA: Diagnosis not present

## 2018-01-09 DIAGNOSIS — G4700 Insomnia, unspecified: Secondary | ICD-10-CM | POA: Diagnosis not present

## 2018-01-15 DIAGNOSIS — R809 Proteinuria, unspecified: Secondary | ICD-10-CM | POA: Diagnosis not present

## 2018-01-15 DIAGNOSIS — D509 Iron deficiency anemia, unspecified: Secondary | ICD-10-CM | POA: Diagnosis not present

## 2018-01-15 DIAGNOSIS — Z79899 Other long term (current) drug therapy: Secondary | ICD-10-CM | POA: Diagnosis not present

## 2018-01-15 DIAGNOSIS — N183 Chronic kidney disease, stage 3 (moderate): Secondary | ICD-10-CM | POA: Diagnosis not present

## 2018-01-15 DIAGNOSIS — E559 Vitamin D deficiency, unspecified: Secondary | ICD-10-CM | POA: Diagnosis not present

## 2018-01-15 DIAGNOSIS — I10 Essential (primary) hypertension: Secondary | ICD-10-CM | POA: Diagnosis not present

## 2018-01-22 DIAGNOSIS — I10 Essential (primary) hypertension: Secondary | ICD-10-CM | POA: Diagnosis not present

## 2018-01-22 DIAGNOSIS — R809 Proteinuria, unspecified: Secondary | ICD-10-CM | POA: Diagnosis not present

## 2018-01-22 DIAGNOSIS — M9080 Osteopathy in diseases classified elsewhere, unspecified site: Secondary | ICD-10-CM | POA: Diagnosis not present

## 2018-01-22 DIAGNOSIS — E889 Metabolic disorder, unspecified: Secondary | ICD-10-CM | POA: Diagnosis not present

## 2018-01-22 DIAGNOSIS — N183 Chronic kidney disease, stage 3 (moderate): Secondary | ICD-10-CM | POA: Diagnosis not present

## 2018-02-19 DIAGNOSIS — E039 Hypothyroidism, unspecified: Secondary | ICD-10-CM | POA: Diagnosis not present

## 2018-02-19 DIAGNOSIS — I10 Essential (primary) hypertension: Secondary | ICD-10-CM | POA: Diagnosis not present

## 2018-02-21 DIAGNOSIS — F5101 Primary insomnia: Secondary | ICD-10-CM | POA: Diagnosis not present

## 2018-02-21 DIAGNOSIS — E782 Mixed hyperlipidemia: Secondary | ICD-10-CM | POA: Diagnosis not present

## 2018-02-21 DIAGNOSIS — R944 Abnormal results of kidney function studies: Secondary | ICD-10-CM | POA: Diagnosis not present

## 2018-02-21 DIAGNOSIS — D509 Iron deficiency anemia, unspecified: Secondary | ICD-10-CM | POA: Diagnosis not present

## 2018-03-13 DIAGNOSIS — X32XXXD Exposure to sunlight, subsequent encounter: Secondary | ICD-10-CM | POA: Diagnosis not present

## 2018-03-13 DIAGNOSIS — C44319 Basal cell carcinoma of skin of other parts of face: Secondary | ICD-10-CM | POA: Diagnosis not present

## 2018-03-13 DIAGNOSIS — Z08 Encounter for follow-up examination after completed treatment for malignant neoplasm: Secondary | ICD-10-CM | POA: Diagnosis not present

## 2018-03-13 DIAGNOSIS — Z1283 Encounter for screening for malignant neoplasm of skin: Secondary | ICD-10-CM | POA: Diagnosis not present

## 2018-03-13 DIAGNOSIS — Z85820 Personal history of malignant melanoma of skin: Secondary | ICD-10-CM | POA: Diagnosis not present

## 2018-03-13 DIAGNOSIS — L570 Actinic keratosis: Secondary | ICD-10-CM | POA: Diagnosis not present

## 2018-06-18 DIAGNOSIS — D509 Iron deficiency anemia, unspecified: Secondary | ICD-10-CM | POA: Diagnosis not present

## 2018-07-02 DIAGNOSIS — M9080 Osteopathy in diseases classified elsewhere, unspecified site: Secondary | ICD-10-CM | POA: Diagnosis not present

## 2018-07-02 DIAGNOSIS — R809 Proteinuria, unspecified: Secondary | ICD-10-CM | POA: Diagnosis not present

## 2018-07-02 DIAGNOSIS — D649 Anemia, unspecified: Secondary | ICD-10-CM | POA: Diagnosis not present

## 2018-07-02 DIAGNOSIS — I10 Essential (primary) hypertension: Secondary | ICD-10-CM | POA: Diagnosis not present

## 2018-07-02 DIAGNOSIS — N183 Chronic kidney disease, stage 3 (moderate): Secondary | ICD-10-CM | POA: Diagnosis not present

## 2018-08-20 DIAGNOSIS — R001 Bradycardia, unspecified: Secondary | ICD-10-CM | POA: Diagnosis not present

## 2018-08-20 DIAGNOSIS — G4700 Insomnia, unspecified: Secondary | ICD-10-CM | POA: Diagnosis not present

## 2018-08-20 DIAGNOSIS — I10 Essential (primary) hypertension: Secondary | ICD-10-CM | POA: Diagnosis not present

## 2018-08-20 DIAGNOSIS — R944 Abnormal results of kidney function studies: Secondary | ICD-10-CM | POA: Diagnosis not present

## 2018-08-20 DIAGNOSIS — E039 Hypothyroidism, unspecified: Secondary | ICD-10-CM | POA: Diagnosis not present

## 2018-08-22 DIAGNOSIS — I10 Essential (primary) hypertension: Secondary | ICD-10-CM | POA: Diagnosis not present

## 2018-08-22 DIAGNOSIS — E782 Mixed hyperlipidemia: Secondary | ICD-10-CM | POA: Diagnosis not present

## 2018-08-22 DIAGNOSIS — D509 Iron deficiency anemia, unspecified: Secondary | ICD-10-CM | POA: Diagnosis not present

## 2018-08-22 DIAGNOSIS — F5101 Primary insomnia: Secondary | ICD-10-CM | POA: Diagnosis not present

## 2018-08-22 DIAGNOSIS — E039 Hypothyroidism, unspecified: Secondary | ICD-10-CM | POA: Diagnosis not present

## 2018-10-02 DIAGNOSIS — I10 Essential (primary) hypertension: Secondary | ICD-10-CM | POA: Diagnosis not present

## 2018-10-02 DIAGNOSIS — E039 Hypothyroidism, unspecified: Secondary | ICD-10-CM | POA: Diagnosis not present

## 2018-10-02 DIAGNOSIS — Z6821 Body mass index (BMI) 21.0-21.9, adult: Secondary | ICD-10-CM | POA: Diagnosis not present

## 2018-10-02 DIAGNOSIS — Z0000 Encounter for general adult medical examination without abnormal findings: Secondary | ICD-10-CM | POA: Diagnosis not present

## 2018-10-02 DIAGNOSIS — D509 Iron deficiency anemia, unspecified: Secondary | ICD-10-CM | POA: Diagnosis not present

## 2018-10-02 DIAGNOSIS — Z23 Encounter for immunization: Secondary | ICD-10-CM | POA: Diagnosis not present

## 2018-10-02 DIAGNOSIS — R944 Abnormal results of kidney function studies: Secondary | ICD-10-CM | POA: Diagnosis not present

## 2018-10-28 DIAGNOSIS — R944 Abnormal results of kidney function studies: Secondary | ICD-10-CM | POA: Diagnosis not present

## 2018-10-28 DIAGNOSIS — Z23 Encounter for immunization: Secondary | ICD-10-CM | POA: Diagnosis not present

## 2018-10-28 DIAGNOSIS — E039 Hypothyroidism, unspecified: Secondary | ICD-10-CM | POA: Diagnosis not present

## 2018-10-28 DIAGNOSIS — I10 Essential (primary) hypertension: Secondary | ICD-10-CM | POA: Diagnosis not present

## 2018-10-28 DIAGNOSIS — G4700 Insomnia, unspecified: Secondary | ICD-10-CM | POA: Diagnosis not present

## 2018-10-28 DIAGNOSIS — R001 Bradycardia, unspecified: Secondary | ICD-10-CM | POA: Diagnosis not present

## 2018-11-05 DIAGNOSIS — R809 Proteinuria, unspecified: Secondary | ICD-10-CM | POA: Diagnosis not present

## 2018-11-05 DIAGNOSIS — I10 Essential (primary) hypertension: Secondary | ICD-10-CM | POA: Diagnosis not present

## 2018-11-05 DIAGNOSIS — D649 Anemia, unspecified: Secondary | ICD-10-CM | POA: Diagnosis not present

## 2018-11-05 DIAGNOSIS — N183 Chronic kidney disease, stage 3 (moderate): Secondary | ICD-10-CM | POA: Diagnosis not present

## 2019-02-26 DIAGNOSIS — D509 Iron deficiency anemia, unspecified: Secondary | ICD-10-CM | POA: Diagnosis not present

## 2019-02-26 DIAGNOSIS — E039 Hypothyroidism, unspecified: Secondary | ICD-10-CM | POA: Diagnosis not present

## 2019-02-26 DIAGNOSIS — I10 Essential (primary) hypertension: Secondary | ICD-10-CM | POA: Diagnosis not present

## 2019-02-26 DIAGNOSIS — N183 Chronic kidney disease, stage 3 (moderate): Secondary | ICD-10-CM | POA: Diagnosis not present

## 2019-02-26 DIAGNOSIS — E782 Mixed hyperlipidemia: Secondary | ICD-10-CM | POA: Diagnosis not present

## 2019-03-05 DIAGNOSIS — G4700 Insomnia, unspecified: Secondary | ICD-10-CM | POA: Diagnosis not present

## 2019-03-05 DIAGNOSIS — N183 Chronic kidney disease, stage 3 (moderate): Secondary | ICD-10-CM | POA: Diagnosis not present

## 2019-03-05 DIAGNOSIS — I129 Hypertensive chronic kidney disease with stage 1 through stage 4 chronic kidney disease, or unspecified chronic kidney disease: Secondary | ICD-10-CM | POA: Diagnosis not present

## 2019-03-05 DIAGNOSIS — E039 Hypothyroidism, unspecified: Secondary | ICD-10-CM | POA: Diagnosis not present

## 2019-03-05 DIAGNOSIS — I10 Essential (primary) hypertension: Secondary | ICD-10-CM | POA: Diagnosis not present

## 2019-05-06 DIAGNOSIS — Z0000 Encounter for general adult medical examination without abnormal findings: Secondary | ICD-10-CM | POA: Diagnosis not present

## 2019-06-05 DIAGNOSIS — R944 Abnormal results of kidney function studies: Secondary | ICD-10-CM | POA: Diagnosis not present

## 2019-06-05 DIAGNOSIS — E039 Hypothyroidism, unspecified: Secondary | ICD-10-CM | POA: Diagnosis not present

## 2019-06-05 DIAGNOSIS — I10 Essential (primary) hypertension: Secondary | ICD-10-CM | POA: Diagnosis not present

## 2019-06-05 DIAGNOSIS — G4700 Insomnia, unspecified: Secondary | ICD-10-CM | POA: Diagnosis not present

## 2019-06-05 DIAGNOSIS — R001 Bradycardia, unspecified: Secondary | ICD-10-CM | POA: Diagnosis not present

## 2019-06-19 DIAGNOSIS — Z1283 Encounter for screening for malignant neoplasm of skin: Secondary | ICD-10-CM | POA: Diagnosis not present

## 2019-06-19 DIAGNOSIS — Z86006 Personal history of melanoma in-situ: Secondary | ICD-10-CM | POA: Diagnosis not present

## 2019-06-19 DIAGNOSIS — L570 Actinic keratosis: Secondary | ICD-10-CM | POA: Diagnosis not present

## 2019-06-19 DIAGNOSIS — L821 Other seborrheic keratosis: Secondary | ICD-10-CM | POA: Diagnosis not present

## 2019-06-19 DIAGNOSIS — L820 Inflamed seborrheic keratosis: Secondary | ICD-10-CM | POA: Diagnosis not present

## 2019-06-19 DIAGNOSIS — Z08 Encounter for follow-up examination after completed treatment for malignant neoplasm: Secondary | ICD-10-CM | POA: Diagnosis not present

## 2019-06-25 DIAGNOSIS — G4700 Insomnia, unspecified: Secondary | ICD-10-CM | POA: Diagnosis not present

## 2019-06-25 DIAGNOSIS — E039 Hypothyroidism, unspecified: Secondary | ICD-10-CM | POA: Diagnosis not present

## 2019-06-25 DIAGNOSIS — R001 Bradycardia, unspecified: Secondary | ICD-10-CM | POA: Diagnosis not present

## 2019-06-25 DIAGNOSIS — I10 Essential (primary) hypertension: Secondary | ICD-10-CM | POA: Diagnosis not present

## 2019-06-25 DIAGNOSIS — R944 Abnormal results of kidney function studies: Secondary | ICD-10-CM | POA: Diagnosis not present

## 2019-08-22 DIAGNOSIS — I10 Essential (primary) hypertension: Secondary | ICD-10-CM | POA: Diagnosis not present

## 2019-08-22 DIAGNOSIS — E039 Hypothyroidism, unspecified: Secondary | ICD-10-CM | POA: Diagnosis not present

## 2019-08-22 DIAGNOSIS — R001 Bradycardia, unspecified: Secondary | ICD-10-CM | POA: Diagnosis not present

## 2019-08-22 DIAGNOSIS — G4700 Insomnia, unspecified: Secondary | ICD-10-CM | POA: Diagnosis not present

## 2019-08-22 DIAGNOSIS — R944 Abnormal results of kidney function studies: Secondary | ICD-10-CM | POA: Diagnosis not present

## 2019-09-02 DIAGNOSIS — I10 Essential (primary) hypertension: Secondary | ICD-10-CM | POA: Diagnosis not present

## 2019-09-02 DIAGNOSIS — E039 Hypothyroidism, unspecified: Secondary | ICD-10-CM | POA: Diagnosis not present

## 2019-09-02 DIAGNOSIS — R001 Bradycardia, unspecified: Secondary | ICD-10-CM | POA: Diagnosis not present

## 2019-09-02 DIAGNOSIS — R944 Abnormal results of kidney function studies: Secondary | ICD-10-CM | POA: Diagnosis not present

## 2019-09-02 DIAGNOSIS — G4700 Insomnia, unspecified: Secondary | ICD-10-CM | POA: Diagnosis not present

## 2019-09-10 DIAGNOSIS — D509 Iron deficiency anemia, unspecified: Secondary | ICD-10-CM | POA: Diagnosis not present

## 2019-09-10 DIAGNOSIS — E039 Hypothyroidism, unspecified: Secondary | ICD-10-CM | POA: Diagnosis not present

## 2019-09-10 DIAGNOSIS — E782 Mixed hyperlipidemia: Secondary | ICD-10-CM | POA: Diagnosis not present

## 2019-09-10 DIAGNOSIS — I10 Essential (primary) hypertension: Secondary | ICD-10-CM | POA: Diagnosis not present

## 2019-09-17 DIAGNOSIS — I129 Hypertensive chronic kidney disease with stage 1 through stage 4 chronic kidney disease, or unspecified chronic kidney disease: Secondary | ICD-10-CM | POA: Diagnosis not present

## 2019-09-17 DIAGNOSIS — G4700 Insomnia, unspecified: Secondary | ICD-10-CM | POA: Diagnosis not present

## 2019-09-17 DIAGNOSIS — E039 Hypothyroidism, unspecified: Secondary | ICD-10-CM | POA: Diagnosis not present

## 2019-09-17 DIAGNOSIS — N183 Chronic kidney disease, stage 3 (moderate): Secondary | ICD-10-CM | POA: Diagnosis not present

## 2019-09-17 DIAGNOSIS — I10 Essential (primary) hypertension: Secondary | ICD-10-CM | POA: Diagnosis not present

## 2019-09-26 DIAGNOSIS — I129 Hypertensive chronic kidney disease with stage 1 through stage 4 chronic kidney disease, or unspecified chronic kidney disease: Secondary | ICD-10-CM | POA: Diagnosis not present

## 2019-09-26 DIAGNOSIS — E039 Hypothyroidism, unspecified: Secondary | ICD-10-CM | POA: Diagnosis not present

## 2019-09-26 DIAGNOSIS — D509 Iron deficiency anemia, unspecified: Secondary | ICD-10-CM | POA: Diagnosis not present

## 2019-09-26 DIAGNOSIS — G4700 Insomnia, unspecified: Secondary | ICD-10-CM | POA: Diagnosis not present

## 2019-09-26 DIAGNOSIS — I10 Essential (primary) hypertension: Secondary | ICD-10-CM | POA: Diagnosis not present

## 2019-10-03 DIAGNOSIS — Z23 Encounter for immunization: Secondary | ICD-10-CM | POA: Diagnosis not present

## 2019-11-06 DIAGNOSIS — G4700 Insomnia, unspecified: Secondary | ICD-10-CM | POA: Diagnosis not present

## 2019-11-06 DIAGNOSIS — E039 Hypothyroidism, unspecified: Secondary | ICD-10-CM | POA: Diagnosis not present

## 2019-11-06 DIAGNOSIS — R944 Abnormal results of kidney function studies: Secondary | ICD-10-CM | POA: Diagnosis not present

## 2019-11-06 DIAGNOSIS — R001 Bradycardia, unspecified: Secondary | ICD-10-CM | POA: Diagnosis not present

## 2019-11-06 DIAGNOSIS — I10 Essential (primary) hypertension: Secondary | ICD-10-CM | POA: Diagnosis not present

## 2019-12-10 DIAGNOSIS — R944 Abnormal results of kidney function studies: Secondary | ICD-10-CM | POA: Diagnosis not present

## 2019-12-10 DIAGNOSIS — N183 Chronic kidney disease, stage 3 unspecified: Secondary | ICD-10-CM | POA: Diagnosis not present

## 2019-12-10 DIAGNOSIS — I129 Hypertensive chronic kidney disease with stage 1 through stage 4 chronic kidney disease, or unspecified chronic kidney disease: Secondary | ICD-10-CM | POA: Diagnosis not present

## 2019-12-10 DIAGNOSIS — E782 Mixed hyperlipidemia: Secondary | ICD-10-CM | POA: Diagnosis not present

## 2020-02-09 DIAGNOSIS — E782 Mixed hyperlipidemia: Secondary | ICD-10-CM | POA: Diagnosis not present

## 2020-02-09 DIAGNOSIS — I129 Hypertensive chronic kidney disease with stage 1 through stage 4 chronic kidney disease, or unspecified chronic kidney disease: Secondary | ICD-10-CM | POA: Diagnosis not present

## 2020-02-09 DIAGNOSIS — R944 Abnormal results of kidney function studies: Secondary | ICD-10-CM | POA: Diagnosis not present

## 2020-02-09 DIAGNOSIS — N183 Chronic kidney disease, stage 3 unspecified: Secondary | ICD-10-CM | POA: Diagnosis not present

## 2020-03-15 DIAGNOSIS — D696 Thrombocytopenia, unspecified: Secondary | ICD-10-CM | POA: Diagnosis not present

## 2020-03-15 DIAGNOSIS — E039 Hypothyroidism, unspecified: Secondary | ICD-10-CM | POA: Diagnosis not present

## 2020-03-15 DIAGNOSIS — E782 Mixed hyperlipidemia: Secondary | ICD-10-CM | POA: Diagnosis not present

## 2020-03-15 DIAGNOSIS — D509 Iron deficiency anemia, unspecified: Secondary | ICD-10-CM | POA: Diagnosis not present

## 2020-03-22 DIAGNOSIS — I10 Essential (primary) hypertension: Secondary | ICD-10-CM | POA: Diagnosis not present

## 2020-03-22 DIAGNOSIS — I129 Hypertensive chronic kidney disease with stage 1 through stage 4 chronic kidney disease, or unspecified chronic kidney disease: Secondary | ICD-10-CM | POA: Diagnosis not present

## 2020-03-22 DIAGNOSIS — N183 Chronic kidney disease, stage 3 unspecified: Secondary | ICD-10-CM | POA: Diagnosis not present

## 2020-03-22 DIAGNOSIS — G4700 Insomnia, unspecified: Secondary | ICD-10-CM | POA: Diagnosis not present

## 2020-03-22 DIAGNOSIS — Z0001 Encounter for general adult medical examination with abnormal findings: Secondary | ICD-10-CM | POA: Diagnosis not present

## 2020-03-22 DIAGNOSIS — E039 Hypothyroidism, unspecified: Secondary | ICD-10-CM | POA: Diagnosis not present

## 2020-05-03 DIAGNOSIS — I129 Hypertensive chronic kidney disease with stage 1 through stage 4 chronic kidney disease, or unspecified chronic kidney disease: Secondary | ICD-10-CM | POA: Diagnosis not present

## 2020-05-03 DIAGNOSIS — E782 Mixed hyperlipidemia: Secondary | ICD-10-CM | POA: Diagnosis not present

## 2020-05-03 DIAGNOSIS — D509 Iron deficiency anemia, unspecified: Secondary | ICD-10-CM | POA: Diagnosis not present

## 2020-05-03 DIAGNOSIS — I10 Essential (primary) hypertension: Secondary | ICD-10-CM | POA: Diagnosis not present

## 2020-05-03 DIAGNOSIS — N183 Chronic kidney disease, stage 3 unspecified: Secondary | ICD-10-CM | POA: Diagnosis not present

## 2020-06-08 DIAGNOSIS — I129 Hypertensive chronic kidney disease with stage 1 through stage 4 chronic kidney disease, or unspecified chronic kidney disease: Secondary | ICD-10-CM | POA: Diagnosis not present

## 2020-06-08 DIAGNOSIS — N183 Chronic kidney disease, stage 3 unspecified: Secondary | ICD-10-CM | POA: Diagnosis not present

## 2020-06-08 DIAGNOSIS — I10 Essential (primary) hypertension: Secondary | ICD-10-CM | POA: Diagnosis not present

## 2020-06-08 DIAGNOSIS — D509 Iron deficiency anemia, unspecified: Secondary | ICD-10-CM | POA: Diagnosis not present

## 2020-06-08 DIAGNOSIS — E782 Mixed hyperlipidemia: Secondary | ICD-10-CM | POA: Diagnosis not present

## 2020-07-15 DIAGNOSIS — H35372 Puckering of macula, left eye: Secondary | ICD-10-CM | POA: Diagnosis not present

## 2020-07-15 DIAGNOSIS — Z961 Presence of intraocular lens: Secondary | ICD-10-CM | POA: Diagnosis not present

## 2020-07-15 DIAGNOSIS — H401122 Primary open-angle glaucoma, left eye, moderate stage: Secondary | ICD-10-CM | POA: Diagnosis not present

## 2020-07-27 DIAGNOSIS — E782 Mixed hyperlipidemia: Secondary | ICD-10-CM | POA: Diagnosis not present

## 2020-07-27 DIAGNOSIS — D509 Iron deficiency anemia, unspecified: Secondary | ICD-10-CM | POA: Diagnosis not present

## 2020-07-27 DIAGNOSIS — I10 Essential (primary) hypertension: Secondary | ICD-10-CM | POA: Diagnosis not present

## 2020-07-27 DIAGNOSIS — H401123 Primary open-angle glaucoma, left eye, severe stage: Secondary | ICD-10-CM | POA: Diagnosis not present

## 2020-07-27 DIAGNOSIS — I129 Hypertensive chronic kidney disease with stage 1 through stage 4 chronic kidney disease, or unspecified chronic kidney disease: Secondary | ICD-10-CM | POA: Diagnosis not present

## 2020-07-27 DIAGNOSIS — N183 Chronic kidney disease, stage 3 unspecified: Secondary | ICD-10-CM | POA: Diagnosis not present

## 2020-09-13 DIAGNOSIS — E039 Hypothyroidism, unspecified: Secondary | ICD-10-CM | POA: Diagnosis not present

## 2020-09-13 DIAGNOSIS — Z23 Encounter for immunization: Secondary | ICD-10-CM | POA: Diagnosis not present

## 2020-09-13 DIAGNOSIS — I10 Essential (primary) hypertension: Secondary | ICD-10-CM | POA: Diagnosis not present

## 2020-09-13 DIAGNOSIS — R944 Abnormal results of kidney function studies: Secondary | ICD-10-CM | POA: Diagnosis not present

## 2020-09-13 DIAGNOSIS — N183 Chronic kidney disease, stage 3 unspecified: Secondary | ICD-10-CM | POA: Diagnosis not present

## 2020-09-20 DIAGNOSIS — I129 Hypertensive chronic kidney disease with stage 1 through stage 4 chronic kidney disease, or unspecified chronic kidney disease: Secondary | ICD-10-CM | POA: Diagnosis not present

## 2020-09-20 DIAGNOSIS — G4700 Insomnia, unspecified: Secondary | ICD-10-CM | POA: Diagnosis not present

## 2020-09-20 DIAGNOSIS — E039 Hypothyroidism, unspecified: Secondary | ICD-10-CM | POA: Diagnosis not present

## 2020-09-20 DIAGNOSIS — I10 Essential (primary) hypertension: Secondary | ICD-10-CM | POA: Diagnosis not present

## 2020-09-20 DIAGNOSIS — N183 Chronic kidney disease, stage 3 unspecified: Secondary | ICD-10-CM | POA: Diagnosis not present

## 2020-10-18 DIAGNOSIS — Z23 Encounter for immunization: Secondary | ICD-10-CM | POA: Diagnosis not present

## 2020-10-25 DIAGNOSIS — H401113 Primary open-angle glaucoma, right eye, severe stage: Secondary | ICD-10-CM | POA: Diagnosis not present

## 2020-10-25 DIAGNOSIS — H524 Presbyopia: Secondary | ICD-10-CM | POA: Diagnosis not present

## 2020-10-28 DIAGNOSIS — N183 Chronic kidney disease, stage 3 unspecified: Secondary | ICD-10-CM | POA: Diagnosis not present

## 2020-10-28 DIAGNOSIS — N1832 Chronic kidney disease, stage 3b: Secondary | ICD-10-CM | POA: Diagnosis not present

## 2020-10-28 DIAGNOSIS — E7849 Other hyperlipidemia: Secondary | ICD-10-CM | POA: Diagnosis not present

## 2020-10-28 DIAGNOSIS — N200 Calculus of kidney: Secondary | ICD-10-CM | POA: Diagnosis not present

## 2020-10-28 DIAGNOSIS — I129 Hypertensive chronic kidney disease with stage 1 through stage 4 chronic kidney disease, or unspecified chronic kidney disease: Secondary | ICD-10-CM | POA: Diagnosis not present

## 2020-10-28 DIAGNOSIS — R768 Other specified abnormal immunological findings in serum: Secondary | ICD-10-CM | POA: Diagnosis not present

## 2020-10-28 DIAGNOSIS — D696 Thrombocytopenia, unspecified: Secondary | ICD-10-CM | POA: Diagnosis not present

## 2020-11-02 ENCOUNTER — Other Ambulatory Visit (HOSPITAL_COMMUNITY): Payer: Self-pay | Admitting: Nephrology

## 2020-11-02 DIAGNOSIS — I129 Hypertensive chronic kidney disease with stage 1 through stage 4 chronic kidney disease, or unspecified chronic kidney disease: Secondary | ICD-10-CM

## 2020-11-02 DIAGNOSIS — N1832 Chronic kidney disease, stage 3b: Secondary | ICD-10-CM

## 2020-11-02 DIAGNOSIS — D696 Thrombocytopenia, unspecified: Secondary | ICD-10-CM

## 2020-11-10 ENCOUNTER — Other Ambulatory Visit: Payer: Self-pay

## 2020-11-10 ENCOUNTER — Ambulatory Visit (HOSPITAL_COMMUNITY)
Admission: RE | Admit: 2020-11-10 | Discharge: 2020-11-10 | Disposition: A | Discharge status: Home / Self Care | Payer: Medicare Other | Source: Ambulatory Visit | Attending: Nephrology | Admitting: Nephrology

## 2020-11-10 DIAGNOSIS — N1832 Chronic kidney disease, stage 3b: Secondary | ICD-10-CM | POA: Diagnosis not present

## 2020-11-10 DIAGNOSIS — D696 Thrombocytopenia, unspecified: Secondary | ICD-10-CM | POA: Diagnosis not present

## 2020-11-10 DIAGNOSIS — N200 Calculus of kidney: Secondary | ICD-10-CM | POA: Diagnosis not present

## 2020-11-10 DIAGNOSIS — I129 Hypertensive chronic kidney disease with stage 1 through stage 4 chronic kidney disease, or unspecified chronic kidney disease: Secondary | ICD-10-CM | POA: Insufficient documentation

## 2020-11-24 DIAGNOSIS — N1832 Chronic kidney disease, stage 3b: Secondary | ICD-10-CM | POA: Diagnosis not present

## 2020-11-24 DIAGNOSIS — D696 Thrombocytopenia, unspecified: Secondary | ICD-10-CM | POA: Diagnosis not present

## 2020-11-24 DIAGNOSIS — I129 Hypertensive chronic kidney disease with stage 1 through stage 4 chronic kidney disease, or unspecified chronic kidney disease: Secondary | ICD-10-CM | POA: Diagnosis not present

## 2020-11-24 DIAGNOSIS — R768 Other specified abnormal immunological findings in serum: Secondary | ICD-10-CM | POA: Diagnosis not present

## 2021-01-13 DIAGNOSIS — R768 Other specified abnormal immunological findings in serum: Secondary | ICD-10-CM | POA: Diagnosis not present

## 2021-01-13 DIAGNOSIS — D696 Thrombocytopenia, unspecified: Secondary | ICD-10-CM | POA: Diagnosis not present

## 2021-01-13 DIAGNOSIS — I129 Hypertensive chronic kidney disease with stage 1 through stage 4 chronic kidney disease, or unspecified chronic kidney disease: Secondary | ICD-10-CM | POA: Diagnosis not present

## 2021-01-13 DIAGNOSIS — N1832 Chronic kidney disease, stage 3b: Secondary | ICD-10-CM | POA: Diagnosis not present

## 2021-01-22 DIAGNOSIS — F5101 Primary insomnia: Secondary | ICD-10-CM | POA: Diagnosis not present

## 2021-01-22 DIAGNOSIS — R001 Bradycardia, unspecified: Secondary | ICD-10-CM | POA: Diagnosis not present

## 2021-01-22 DIAGNOSIS — E039 Hypothyroidism, unspecified: Secondary | ICD-10-CM | POA: Diagnosis not present

## 2021-01-22 DIAGNOSIS — G4700 Insomnia, unspecified: Secondary | ICD-10-CM | POA: Diagnosis not present

## 2021-01-22 DIAGNOSIS — E782 Mixed hyperlipidemia: Secondary | ICD-10-CM | POA: Diagnosis not present

## 2021-01-22 DIAGNOSIS — I10 Essential (primary) hypertension: Secondary | ICD-10-CM | POA: Diagnosis not present

## 2021-01-22 DIAGNOSIS — D509 Iron deficiency anemia, unspecified: Secondary | ICD-10-CM | POA: Diagnosis not present

## 2021-01-22 DIAGNOSIS — N183 Chronic kidney disease, stage 3 unspecified: Secondary | ICD-10-CM | POA: Diagnosis not present

## 2021-01-22 DIAGNOSIS — R944 Abnormal results of kidney function studies: Secondary | ICD-10-CM | POA: Diagnosis not present

## 2021-01-22 DIAGNOSIS — D696 Thrombocytopenia, unspecified: Secondary | ICD-10-CM | POA: Diagnosis not present

## 2021-01-26 DIAGNOSIS — I129 Hypertensive chronic kidney disease with stage 1 through stage 4 chronic kidney disease, or unspecified chronic kidney disease: Secondary | ICD-10-CM | POA: Diagnosis not present

## 2021-01-26 DIAGNOSIS — R768 Other specified abnormal immunological findings in serum: Secondary | ICD-10-CM | POA: Diagnosis not present

## 2021-01-26 DIAGNOSIS — N170 Acute kidney failure with tubular necrosis: Secondary | ICD-10-CM | POA: Diagnosis not present

## 2021-01-26 DIAGNOSIS — D696 Thrombocytopenia, unspecified: Secondary | ICD-10-CM | POA: Diagnosis not present

## 2021-01-26 DIAGNOSIS — N1832 Chronic kidney disease, stage 3b: Secondary | ICD-10-CM | POA: Diagnosis not present

## 2021-02-01 DIAGNOSIS — H401123 Primary open-angle glaucoma, left eye, severe stage: Secondary | ICD-10-CM | POA: Diagnosis not present

## 2021-02-09 DIAGNOSIS — N170 Acute kidney failure with tubular necrosis: Secondary | ICD-10-CM | POA: Diagnosis not present

## 2021-02-09 DIAGNOSIS — N1832 Chronic kidney disease, stage 3b: Secondary | ICD-10-CM | POA: Diagnosis not present

## 2021-02-09 DIAGNOSIS — I129 Hypertensive chronic kidney disease with stage 1 through stage 4 chronic kidney disease, or unspecified chronic kidney disease: Secondary | ICD-10-CM | POA: Diagnosis not present

## 2021-02-09 DIAGNOSIS — D696 Thrombocytopenia, unspecified: Secondary | ICD-10-CM | POA: Diagnosis not present

## 2021-02-14 DIAGNOSIS — N200 Calculus of kidney: Secondary | ICD-10-CM | POA: Diagnosis not present

## 2021-02-14 DIAGNOSIS — N170 Acute kidney failure with tubular necrosis: Secondary | ICD-10-CM | POA: Diagnosis not present

## 2021-02-14 DIAGNOSIS — N1832 Chronic kidney disease, stage 3b: Secondary | ICD-10-CM | POA: Diagnosis not present

## 2021-02-14 DIAGNOSIS — I129 Hypertensive chronic kidney disease with stage 1 through stage 4 chronic kidney disease, or unspecified chronic kidney disease: Secondary | ICD-10-CM | POA: Diagnosis not present

## 2021-02-14 DIAGNOSIS — D638 Anemia in other chronic diseases classified elsewhere: Secondary | ICD-10-CM | POA: Diagnosis not present

## 2021-02-21 DIAGNOSIS — R944 Abnormal results of kidney function studies: Secondary | ICD-10-CM | POA: Diagnosis not present

## 2021-02-21 DIAGNOSIS — R001 Bradycardia, unspecified: Secondary | ICD-10-CM | POA: Diagnosis not present

## 2021-02-21 DIAGNOSIS — D696 Thrombocytopenia, unspecified: Secondary | ICD-10-CM | POA: Diagnosis not present

## 2021-02-21 DIAGNOSIS — F5101 Primary insomnia: Secondary | ICD-10-CM | POA: Diagnosis not present

## 2021-02-21 DIAGNOSIS — N183 Chronic kidney disease, stage 3 unspecified: Secondary | ICD-10-CM | POA: Diagnosis not present

## 2021-02-21 DIAGNOSIS — E039 Hypothyroidism, unspecified: Secondary | ICD-10-CM | POA: Diagnosis not present

## 2021-02-21 DIAGNOSIS — I10 Essential (primary) hypertension: Secondary | ICD-10-CM | POA: Diagnosis not present

## 2021-02-21 DIAGNOSIS — E782 Mixed hyperlipidemia: Secondary | ICD-10-CM | POA: Diagnosis not present

## 2021-02-21 DIAGNOSIS — D509 Iron deficiency anemia, unspecified: Secondary | ICD-10-CM | POA: Diagnosis not present

## 2021-02-21 DIAGNOSIS — G4700 Insomnia, unspecified: Secondary | ICD-10-CM | POA: Diagnosis not present

## 2021-02-24 ENCOUNTER — Ambulatory Visit: Payer: Medicare Other | Admitting: Rheumatology

## 2021-03-08 DIAGNOSIS — E039 Hypothyroidism, unspecified: Secondary | ICD-10-CM | POA: Diagnosis not present

## 2021-03-08 DIAGNOSIS — Z23 Encounter for immunization: Secondary | ICD-10-CM | POA: Diagnosis not present

## 2021-03-08 DIAGNOSIS — R944 Abnormal results of kidney function studies: Secondary | ICD-10-CM | POA: Diagnosis not present

## 2021-03-08 DIAGNOSIS — N183 Chronic kidney disease, stage 3 unspecified: Secondary | ICD-10-CM | POA: Diagnosis not present

## 2021-03-08 DIAGNOSIS — I10 Essential (primary) hypertension: Secondary | ICD-10-CM | POA: Diagnosis not present

## 2021-03-15 DIAGNOSIS — G4700 Insomnia, unspecified: Secondary | ICD-10-CM | POA: Diagnosis not present

## 2021-03-15 DIAGNOSIS — D696 Thrombocytopenia, unspecified: Secondary | ICD-10-CM | POA: Diagnosis not present

## 2021-03-15 DIAGNOSIS — D509 Iron deficiency anemia, unspecified: Secondary | ICD-10-CM | POA: Diagnosis not present

## 2021-03-15 DIAGNOSIS — I10 Essential (primary) hypertension: Secondary | ICD-10-CM | POA: Diagnosis not present

## 2021-03-15 DIAGNOSIS — E782 Mixed hyperlipidemia: Secondary | ICD-10-CM | POA: Diagnosis not present

## 2021-03-15 DIAGNOSIS — N183 Chronic kidney disease, stage 3 unspecified: Secondary | ICD-10-CM | POA: Diagnosis not present

## 2021-03-15 DIAGNOSIS — E039 Hypothyroidism, unspecified: Secondary | ICD-10-CM | POA: Diagnosis not present

## 2021-03-15 DIAGNOSIS — I129 Hypertensive chronic kidney disease with stage 1 through stage 4 chronic kidney disease, or unspecified chronic kidney disease: Secondary | ICD-10-CM | POA: Diagnosis not present

## 2021-03-23 DIAGNOSIS — F5101 Primary insomnia: Secondary | ICD-10-CM | POA: Diagnosis not present

## 2021-03-23 DIAGNOSIS — I129 Hypertensive chronic kidney disease with stage 1 through stage 4 chronic kidney disease, or unspecified chronic kidney disease: Secondary | ICD-10-CM | POA: Diagnosis not present

## 2021-03-23 DIAGNOSIS — E039 Hypothyroidism, unspecified: Secondary | ICD-10-CM | POA: Diagnosis not present

## 2021-03-23 DIAGNOSIS — D696 Thrombocytopenia, unspecified: Secondary | ICD-10-CM | POA: Diagnosis not present

## 2021-03-23 DIAGNOSIS — D509 Iron deficiency anemia, unspecified: Secondary | ICD-10-CM | POA: Diagnosis not present

## 2021-03-23 DIAGNOSIS — R944 Abnormal results of kidney function studies: Secondary | ICD-10-CM | POA: Diagnosis not present

## 2021-03-23 DIAGNOSIS — R001 Bradycardia, unspecified: Secondary | ICD-10-CM | POA: Diagnosis not present

## 2021-03-23 DIAGNOSIS — E782 Mixed hyperlipidemia: Secondary | ICD-10-CM | POA: Diagnosis not present

## 2021-03-23 DIAGNOSIS — G4700 Insomnia, unspecified: Secondary | ICD-10-CM | POA: Diagnosis not present

## 2021-03-23 DIAGNOSIS — I10 Essential (primary) hypertension: Secondary | ICD-10-CM | POA: Diagnosis not present

## 2021-03-24 ENCOUNTER — Ambulatory Visit: Payer: Medicare Other | Admitting: Rheumatology

## 2021-03-30 DIAGNOSIS — H401113 Primary open-angle glaucoma, right eye, severe stage: Secondary | ICD-10-CM | POA: Diagnosis not present

## 2021-03-30 DIAGNOSIS — H524 Presbyopia: Secondary | ICD-10-CM | POA: Diagnosis not present

## 2021-04-13 DIAGNOSIS — I129 Hypertensive chronic kidney disease with stage 1 through stage 4 chronic kidney disease, or unspecified chronic kidney disease: Secondary | ICD-10-CM | POA: Diagnosis not present

## 2021-04-13 DIAGNOSIS — D638 Anemia in other chronic diseases classified elsewhere: Secondary | ICD-10-CM | POA: Diagnosis not present

## 2021-04-13 DIAGNOSIS — N1832 Chronic kidney disease, stage 3b: Secondary | ICD-10-CM | POA: Diagnosis not present

## 2021-04-13 DIAGNOSIS — D696 Thrombocytopenia, unspecified: Secondary | ICD-10-CM | POA: Diagnosis not present

## 2021-07-04 DIAGNOSIS — D696 Thrombocytopenia, unspecified: Secondary | ICD-10-CM | POA: Diagnosis not present

## 2021-07-04 DIAGNOSIS — N1832 Chronic kidney disease, stage 3b: Secondary | ICD-10-CM | POA: Diagnosis not present

## 2021-07-04 DIAGNOSIS — D638 Anemia in other chronic diseases classified elsewhere: Secondary | ICD-10-CM | POA: Diagnosis not present

## 2021-07-04 DIAGNOSIS — I129 Hypertensive chronic kidney disease with stage 1 through stage 4 chronic kidney disease, or unspecified chronic kidney disease: Secondary | ICD-10-CM | POA: Diagnosis not present

## 2021-07-13 DIAGNOSIS — D696 Thrombocytopenia, unspecified: Secondary | ICD-10-CM | POA: Diagnosis not present

## 2021-07-13 DIAGNOSIS — N1832 Chronic kidney disease, stage 3b: Secondary | ICD-10-CM | POA: Diagnosis not present

## 2021-07-13 DIAGNOSIS — N200 Calculus of kidney: Secondary | ICD-10-CM | POA: Diagnosis not present

## 2021-07-13 DIAGNOSIS — I129 Hypertensive chronic kidney disease with stage 1 through stage 4 chronic kidney disease, or unspecified chronic kidney disease: Secondary | ICD-10-CM | POA: Diagnosis not present

## 2021-08-03 DIAGNOSIS — H524 Presbyopia: Secondary | ICD-10-CM | POA: Diagnosis not present

## 2021-08-03 DIAGNOSIS — H401113 Primary open-angle glaucoma, right eye, severe stage: Secondary | ICD-10-CM | POA: Diagnosis not present

## 2021-08-05 ENCOUNTER — Ambulatory Visit (HOSPITAL_COMMUNITY): Payer: Medicare Other | Admitting: Hematology and Oncology

## 2021-08-07 IMAGING — US US RENAL
1 series · 14 of 25 positions shown · non-contrast
Comparison: Renal ultrasound 04/23/2017

CLINICAL DATA: Nephrolithiasis

EXAM:
RENAL / URINARY TRACT ULTRASOUND COMPLETE

[Series 1: us renal · 0.16mm/px · 14 of 89 slices shown]
[im 1/89]
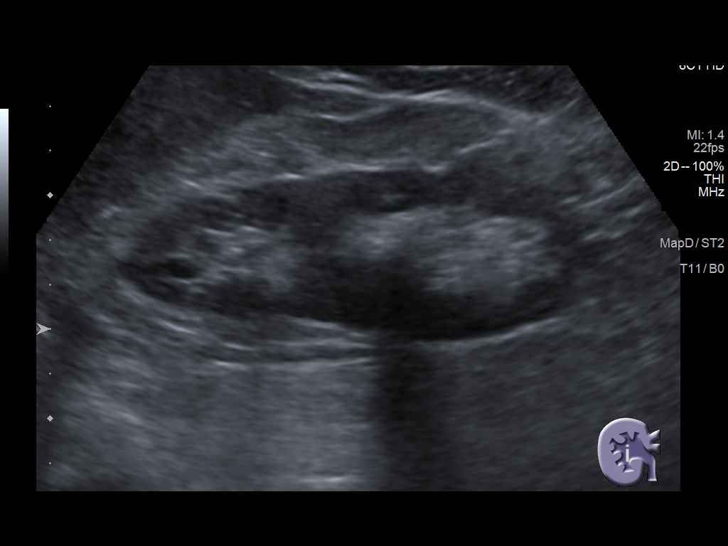
[im 8/89]
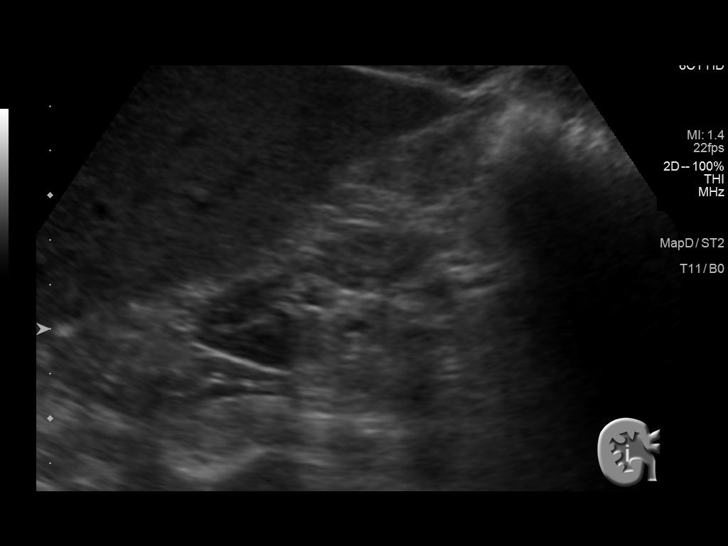
[im 15/89]
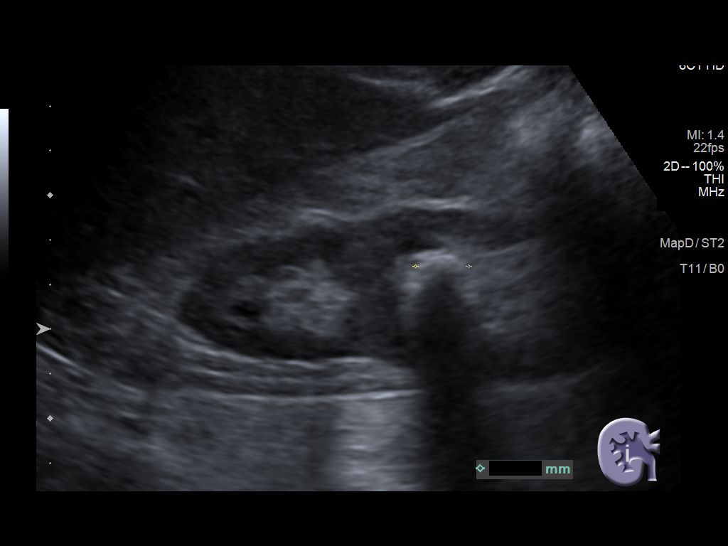
[im 23/89]
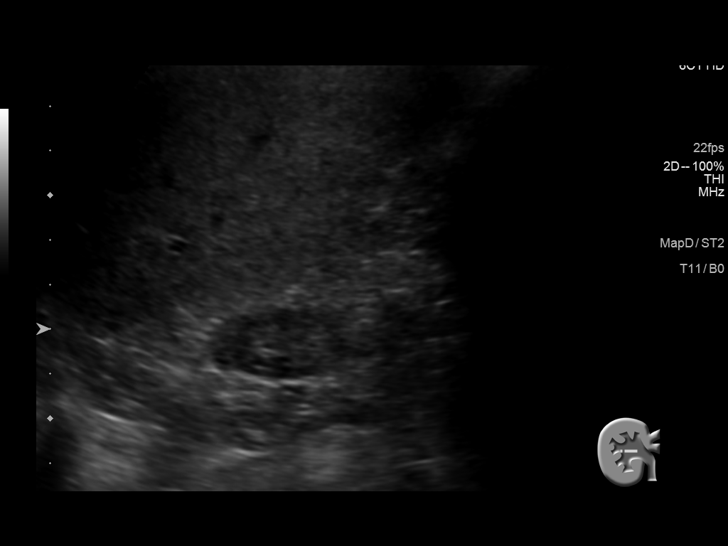
[im 30/89]
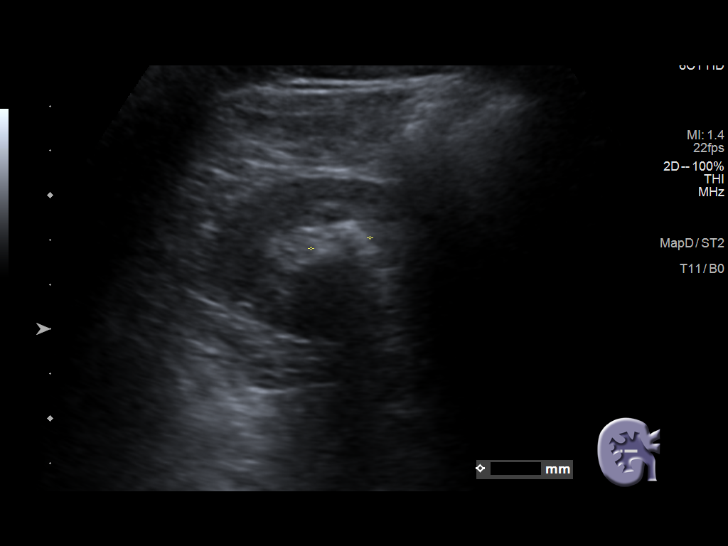
[im 34/89]
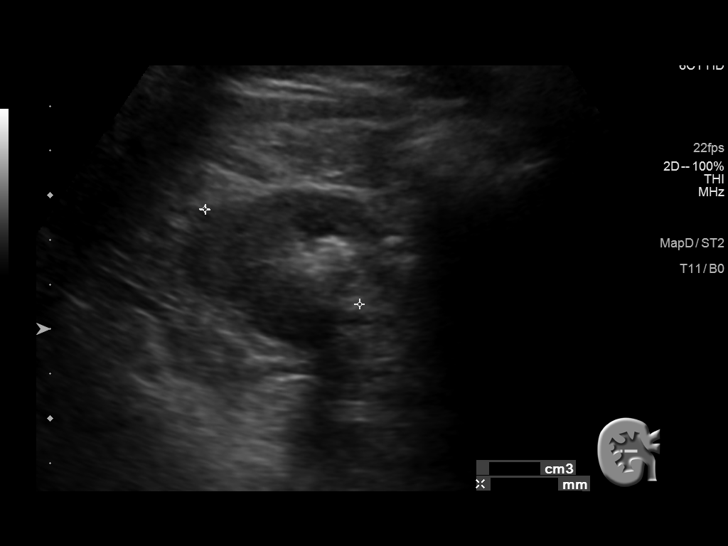
[im 41/89]
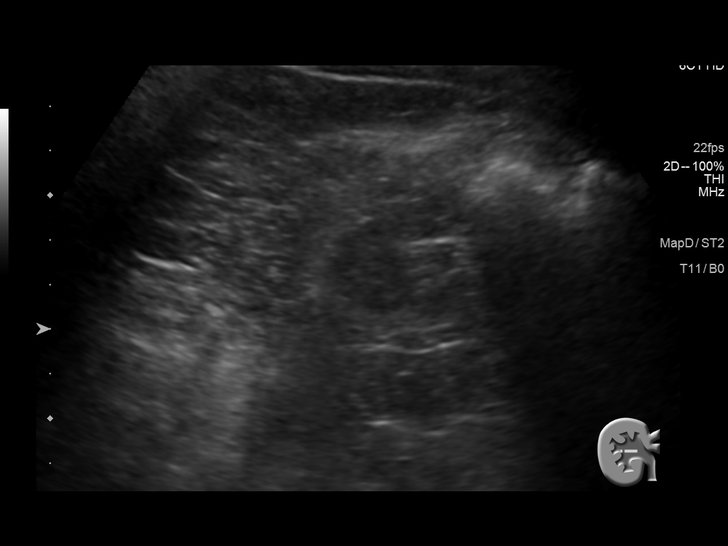
[im 48/89]
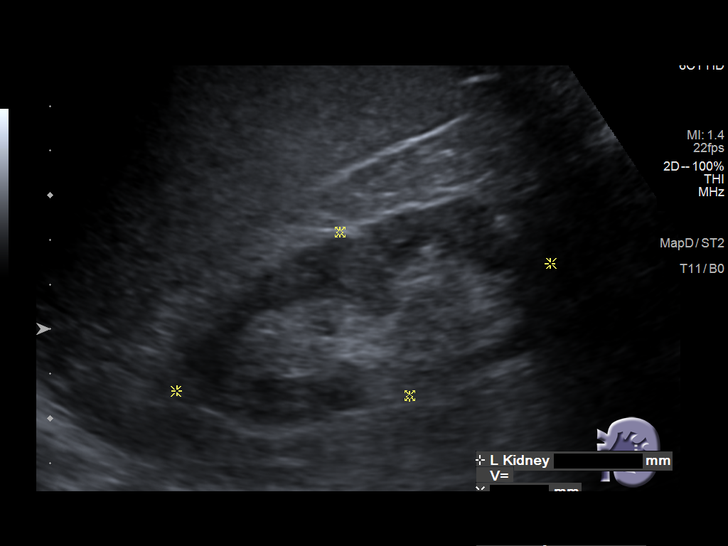
[im 56/89]
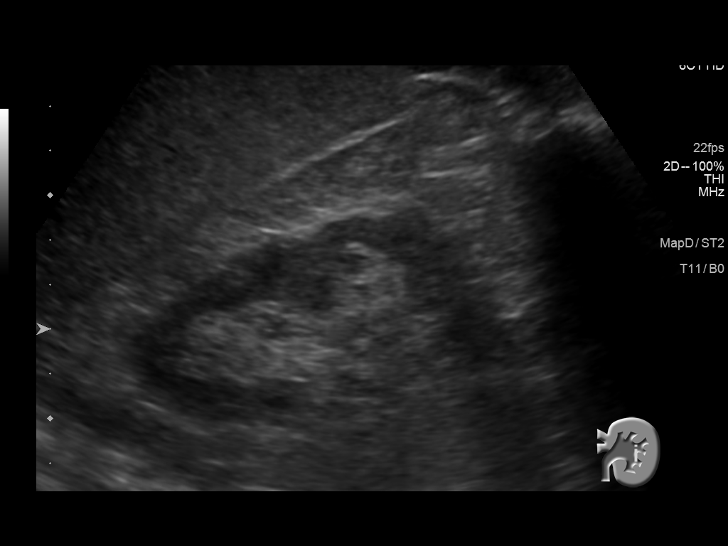
[im 59/89]
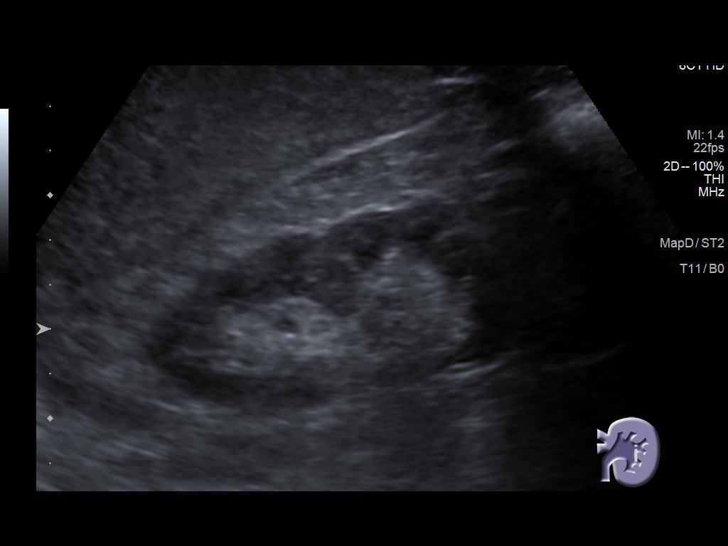
[im 67/89]
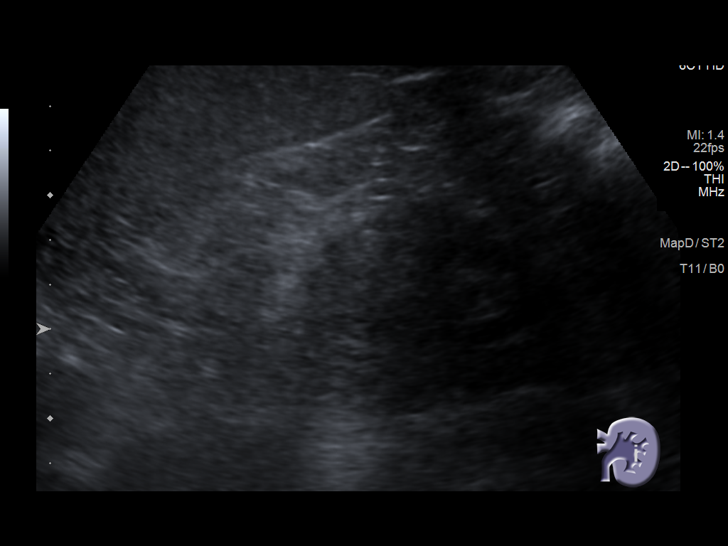
[im 74/89]
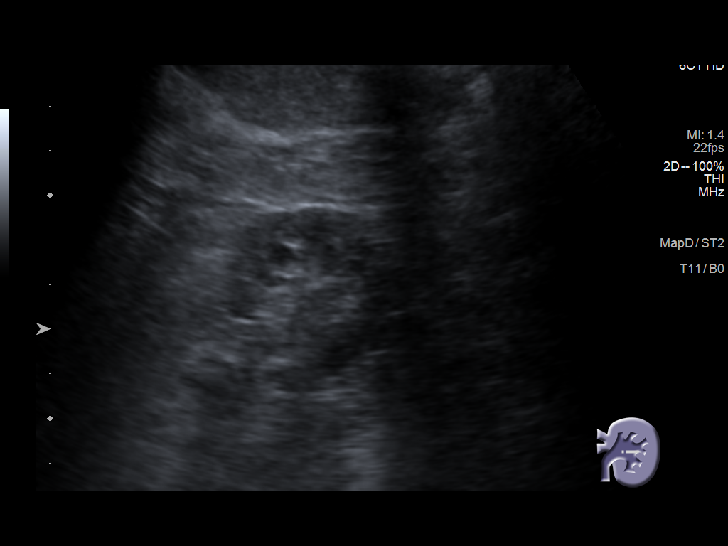
[im 81/89]
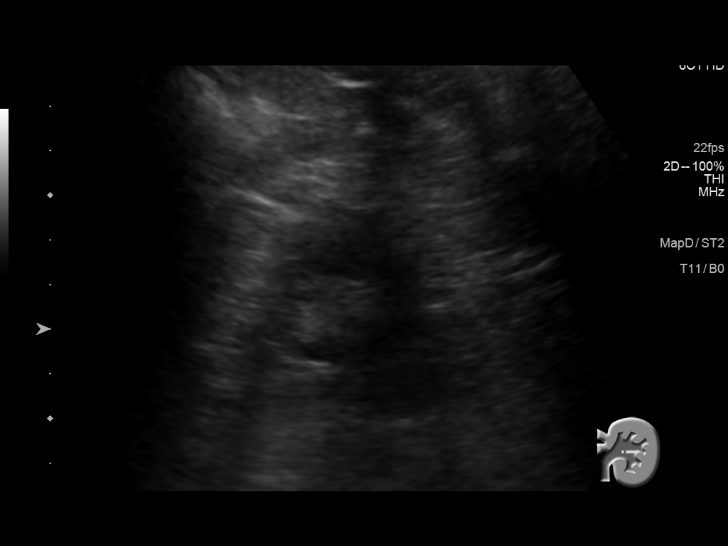
[im 89/89]
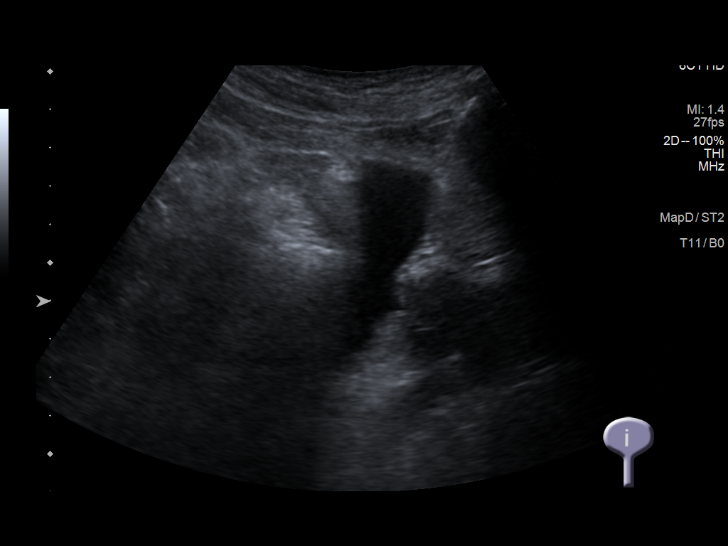

[14 of 25 positions shown; findings below may reference images not displayed]

FINDINGS: Right Kidney:

Renal measurements: 10.4 x 8.8 x 4.1 cm = volume: 83 mL. Duplicated
renal collecting system. Echogenicity appears mildly increased.
There is a shadowing calculus in the midpole measuring 1.2 cm. No
hydronephrosis. No renal mass.

Left Kidney:

Renal measurements: 8.8 x 4.0 x 3.5 cm = volume: 66 mL. Echogenicity
appears mildly increased. No mass or hydronephrosis visualized.

Bladder:

Appears normal for degree of bladder distention.

Other:

None.
IMPRESSION: 1.  Nonobstructing right renal calculus measuring 1.2 cm.

2. Mildly increased echogenicity of the bilateral kidneys as can be
seen in medical renal disease.

## 2021-08-30 ENCOUNTER — Ambulatory Visit (HOSPITAL_COMMUNITY): Payer: Medicare Other | Admitting: Hematology

## 2021-08-31 ENCOUNTER — Encounter (HOSPITAL_COMMUNITY): Payer: Self-pay

## 2021-08-31 ENCOUNTER — Emergency Department (HOSPITAL_COMMUNITY): Payer: Medicare Other

## 2021-08-31 ENCOUNTER — Other Ambulatory Visit: Payer: Self-pay

## 2021-08-31 ENCOUNTER — Emergency Department (HOSPITAL_COMMUNITY)
Admission: EM | Admit: 2021-08-31 | Discharge: 2021-08-31 | Disposition: A | Discharge status: Home / Self Care | Payer: Medicare Other | Attending: Emergency Medicine | Admitting: Emergency Medicine

## 2021-08-31 DIAGNOSIS — Y92410 Unspecified street and highway as the place of occurrence of the external cause: Secondary | ICD-10-CM | POA: Diagnosis not present

## 2021-08-31 DIAGNOSIS — Z87891 Personal history of nicotine dependence: Secondary | ICD-10-CM | POA: Diagnosis not present

## 2021-08-31 DIAGNOSIS — Z7982 Long term (current) use of aspirin: Secondary | ICD-10-CM | POA: Insufficient documentation

## 2021-08-31 DIAGNOSIS — N200 Calculus of kidney: Secondary | ICD-10-CM | POA: Diagnosis not present

## 2021-08-31 DIAGNOSIS — I499 Cardiac arrhythmia, unspecified: Secondary | ICD-10-CM | POA: Diagnosis not present

## 2021-08-31 DIAGNOSIS — M47816 Spondylosis without myelopathy or radiculopathy, lumbar region: Secondary | ICD-10-CM | POA: Diagnosis not present

## 2021-08-31 DIAGNOSIS — S299XXA Unspecified injury of thorax, initial encounter: Secondary | ICD-10-CM | POA: Insufficient documentation

## 2021-08-31 DIAGNOSIS — R52 Pain, unspecified: Secondary | ICD-10-CM

## 2021-08-31 DIAGNOSIS — I2510 Atherosclerotic heart disease of native coronary artery without angina pectoris: Secondary | ICD-10-CM | POA: Diagnosis not present

## 2021-08-31 DIAGNOSIS — S3991XA Unspecified injury of abdomen, initial encounter: Secondary | ICD-10-CM | POA: Insufficient documentation

## 2021-08-31 DIAGNOSIS — J929 Pleural plaque without asbestos: Secondary | ICD-10-CM | POA: Diagnosis not present

## 2021-08-31 DIAGNOSIS — I10 Essential (primary) hypertension: Secondary | ICD-10-CM | POA: Insufficient documentation

## 2021-08-31 DIAGNOSIS — R079 Chest pain, unspecified: Secondary | ICD-10-CM | POA: Diagnosis not present

## 2021-08-31 DIAGNOSIS — R0789 Other chest pain: Secondary | ICD-10-CM | POA: Diagnosis not present

## 2021-08-31 DIAGNOSIS — I700 Atherosclerosis of aorta: Secondary | ICD-10-CM | POA: Diagnosis not present

## 2021-08-31 DIAGNOSIS — Z743 Need for continuous supervision: Secondary | ICD-10-CM | POA: Diagnosis not present

## 2021-08-31 LAB — COMPREHENSIVE METABOLIC PANEL
ALT: 14 U/L (ref 0–44)
AST: 23 U/L (ref 15–41)
Albumin: 3.4 g/dL — ABNORMAL LOW (ref 3.5–5.0)
Alkaline Phosphatase: 60 U/L (ref 38–126)
Anion gap: 6 (ref 5–15)
BUN: 17 mg/dL (ref 8–23)
CO2: 30 mmol/L (ref 22–32)
Calcium: 8.6 mg/dL — ABNORMAL LOW (ref 8.9–10.3)
Chloride: 103 mmol/L (ref 98–111)
Creatinine, Ser: 1.55 mg/dL — ABNORMAL HIGH (ref 0.61–1.24)
GFR, Estimated: 47 mL/min — ABNORMAL LOW (ref >60)
Glucose, Bld: 106 mg/dL — ABNORMAL HIGH (ref 70–99)
Potassium: 3.5 mmol/L (ref 3.5–5.1)
Sodium: 139 mmol/L (ref 135–145)
Total Bilirubin: 0.8 mg/dL (ref 0.3–1.2)
Total Protein: 6.4 g/dL — ABNORMAL LOW (ref 6.5–8.1)

## 2021-08-31 LAB — CBC WITH DIFFERENTIAL/PLATELET
Abs Immature Granulocytes: 0.01 10*3/uL (ref 0.00–0.07)
Basophils Absolute: 0.1 10*3/uL (ref 0.0–0.1)
Basophils Relative: 1 %
Eosinophils Absolute: 0.2 10*3/uL (ref 0.0–0.5)
Eosinophils Relative: 3 %
HCT: 40.9 % (ref 39.0–52.0)
Hemoglobin: 13.6 g/dL (ref 13.0–17.0)
Immature Granulocytes: 0 %
Lymphocytes Relative: 31 %
Lymphs Abs: 1.6 10*3/uL (ref 0.7–4.0)
MCH: 29.9 pg (ref 26.0–34.0)
MCHC: 33.3 g/dL (ref 30.0–36.0)
MCV: 89.9 fL (ref 80.0–100.0)
Monocytes Absolute: 0.6 10*3/uL (ref 0.1–1.0)
Monocytes Relative: 11 %
Neutro Abs: 2.7 10*3/uL (ref 1.7–7.7)
Neutrophils Relative %: 54 %
Platelets: 165 10*3/uL (ref 150–400)
RBC: 4.55 MIL/uL (ref 4.22–5.81)
RDW: 12.3 % (ref 11.5–15.5)
WBC: 5.1 10*3/uL (ref 4.0–10.5)
nRBC: 0 % (ref 0.0–0.2)

## 2021-08-31 MED ORDER — SODIUM CHLORIDE 0.9 % IV BOLUS
500.0000 mL | Freq: Once | INTRAVENOUS | Status: AC
  Administered 2021-08-31: 500 mL via INTRAVENOUS

## 2021-08-31 MED ORDER — IOHEXOL 350 MG/ML SOLN
100.0000 mL | Freq: Once | INTRAVENOUS | Status: AC | PRN
  Administered 2021-08-31: 80 mL via INTRAVENOUS
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
## 2021-08-31 NOTE — Discharge Instructions (Addendum)
Take Tylenol or Motrin for pain.  Follow-up with your doctor if any problems.  Follow-up with the urologist the next couple weeks for the kidney stone seen on x-ray
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
## 2021-09-15 DIAGNOSIS — E039 Hypothyroidism, unspecified: Secondary | ICD-10-CM | POA: Diagnosis not present

## 2021-09-15 DIAGNOSIS — R7301 Impaired fasting glucose: Secondary | ICD-10-CM | POA: Diagnosis not present

## 2021-09-15 DIAGNOSIS — I10 Essential (primary) hypertension: Secondary | ICD-10-CM | POA: Diagnosis not present

## 2021-09-21 DIAGNOSIS — D696 Thrombocytopenia, unspecified: Secondary | ICD-10-CM | POA: Diagnosis not present

## 2021-09-21 DIAGNOSIS — E782 Mixed hyperlipidemia: Secondary | ICD-10-CM | POA: Diagnosis not present

## 2021-09-21 DIAGNOSIS — D509 Iron deficiency anemia, unspecified: Secondary | ICD-10-CM | POA: Diagnosis not present

## 2021-09-21 DIAGNOSIS — G4700 Insomnia, unspecified: Secondary | ICD-10-CM | POA: Diagnosis not present

## 2021-09-21 DIAGNOSIS — I129 Hypertensive chronic kidney disease with stage 1 through stage 4 chronic kidney disease, or unspecified chronic kidney disease: Secondary | ICD-10-CM | POA: Diagnosis not present

## 2021-09-21 DIAGNOSIS — Z0001 Encounter for general adult medical examination with abnormal findings: Secondary | ICD-10-CM | POA: Diagnosis not present

## 2021-09-21 DIAGNOSIS — R7301 Impaired fasting glucose: Secondary | ICD-10-CM | POA: Diagnosis not present

## 2021-09-21 DIAGNOSIS — E039 Hypothyroidism, unspecified: Secondary | ICD-10-CM | POA: Diagnosis not present

## 2021-09-21 DIAGNOSIS — I10 Essential (primary) hypertension: Secondary | ICD-10-CM | POA: Diagnosis not present

## 2021-09-21 DIAGNOSIS — Z23 Encounter for immunization: Secondary | ICD-10-CM | POA: Diagnosis not present

## 2021-09-29 DIAGNOSIS — N200 Calculus of kidney: Secondary | ICD-10-CM | POA: Diagnosis not present

## 2021-09-29 DIAGNOSIS — N1832 Chronic kidney disease, stage 3b: Secondary | ICD-10-CM | POA: Diagnosis not present

## 2021-09-29 DIAGNOSIS — D696 Thrombocytopenia, unspecified: Secondary | ICD-10-CM | POA: Diagnosis not present

## 2021-09-29 DIAGNOSIS — I129 Hypertensive chronic kidney disease with stage 1 through stage 4 chronic kidney disease, or unspecified chronic kidney disease: Secondary | ICD-10-CM | POA: Diagnosis not present

## 2021-09-30 DIAGNOSIS — G4700 Insomnia, unspecified: Secondary | ICD-10-CM | POA: Diagnosis not present

## 2021-09-30 DIAGNOSIS — I129 Hypertensive chronic kidney disease with stage 1 through stage 4 chronic kidney disease, or unspecified chronic kidney disease: Secondary | ICD-10-CM | POA: Diagnosis not present

## 2021-09-30 DIAGNOSIS — I10 Essential (primary) hypertension: Secondary | ICD-10-CM | POA: Diagnosis not present

## 2021-10-06 DIAGNOSIS — N200 Calculus of kidney: Secondary | ICD-10-CM | POA: Diagnosis not present

## 2021-10-06 DIAGNOSIS — N1832 Chronic kidney disease, stage 3b: Secondary | ICD-10-CM | POA: Diagnosis not present

## 2021-10-06 DIAGNOSIS — D696 Thrombocytopenia, unspecified: Secondary | ICD-10-CM | POA: Diagnosis not present

## 2021-10-06 DIAGNOSIS — I129 Hypertensive chronic kidney disease with stage 1 through stage 4 chronic kidney disease, or unspecified chronic kidney disease: Secondary | ICD-10-CM | POA: Diagnosis not present

## 2021-10-13 DIAGNOSIS — L820 Inflamed seborrheic keratosis: Secondary | ICD-10-CM | POA: Diagnosis not present

## 2021-10-13 DIAGNOSIS — Z08 Encounter for follow-up examination after completed treatment for malignant neoplasm: Secondary | ICD-10-CM | POA: Diagnosis not present

## 2021-10-13 DIAGNOSIS — L570 Actinic keratosis: Secondary | ICD-10-CM | POA: Diagnosis not present

## 2021-10-13 DIAGNOSIS — Z1283 Encounter for screening for malignant neoplasm of skin: Secondary | ICD-10-CM | POA: Diagnosis not present

## 2021-10-13 DIAGNOSIS — Z85820 Personal history of malignant melanoma of skin: Secondary | ICD-10-CM | POA: Diagnosis not present

## 2021-10-13 DIAGNOSIS — X32XXXD Exposure to sunlight, subsequent encounter: Secondary | ICD-10-CM | POA: Diagnosis not present

## 2021-11-09 DIAGNOSIS — I129 Hypertensive chronic kidney disease with stage 1 through stage 4 chronic kidney disease, or unspecified chronic kidney disease: Secondary | ICD-10-CM | POA: Diagnosis not present

## 2021-11-09 DIAGNOSIS — M25561 Pain in right knee: Secondary | ICD-10-CM | POA: Diagnosis not present

## 2021-11-09 DIAGNOSIS — I10 Essential (primary) hypertension: Secondary | ICD-10-CM | POA: Diagnosis not present

## 2021-12-05 DIAGNOSIS — H524 Presbyopia: Secondary | ICD-10-CM | POA: Diagnosis not present

## 2021-12-05 DIAGNOSIS — H401111 Primary open-angle glaucoma, right eye, mild stage: Secondary | ICD-10-CM | POA: Diagnosis not present

## 2021-12-28 DIAGNOSIS — I129 Hypertensive chronic kidney disease with stage 1 through stage 4 chronic kidney disease, or unspecified chronic kidney disease: Secondary | ICD-10-CM | POA: Diagnosis not present

## 2021-12-28 DIAGNOSIS — N1832 Chronic kidney disease, stage 3b: Secondary | ICD-10-CM | POA: Diagnosis not present

## 2021-12-28 DIAGNOSIS — N200 Calculus of kidney: Secondary | ICD-10-CM | POA: Diagnosis not present

## 2021-12-28 DIAGNOSIS — D696 Thrombocytopenia, unspecified: Secondary | ICD-10-CM | POA: Diagnosis not present

## 2022-01-16 DIAGNOSIS — N1832 Chronic kidney disease, stage 3b: Secondary | ICD-10-CM | POA: Diagnosis not present

## 2022-01-16 DIAGNOSIS — N200 Calculus of kidney: Secondary | ICD-10-CM | POA: Diagnosis not present

## 2022-01-16 DIAGNOSIS — I129 Hypertensive chronic kidney disease with stage 1 through stage 4 chronic kidney disease, or unspecified chronic kidney disease: Secondary | ICD-10-CM | POA: Diagnosis not present

## 2022-01-16 DIAGNOSIS — D696 Thrombocytopenia, unspecified: Secondary | ICD-10-CM | POA: Diagnosis not present

## 2022-02-27 DIAGNOSIS — H524 Presbyopia: Secondary | ICD-10-CM | POA: Diagnosis not present

## 2022-02-27 DIAGNOSIS — H401111 Primary open-angle glaucoma, right eye, mild stage: Secondary | ICD-10-CM | POA: Diagnosis not present

## 2022-02-27 DIAGNOSIS — H401123 Primary open-angle glaucoma, left eye, severe stage: Secondary | ICD-10-CM | POA: Diagnosis not present

## 2022-03-20 DIAGNOSIS — E039 Hypothyroidism, unspecified: Secondary | ICD-10-CM | POA: Diagnosis not present

## 2022-03-20 DIAGNOSIS — R7301 Impaired fasting glucose: Secondary | ICD-10-CM | POA: Diagnosis not present

## 2022-03-20 DIAGNOSIS — E782 Mixed hyperlipidemia: Secondary | ICD-10-CM | POA: Diagnosis not present

## 2022-03-22 DIAGNOSIS — E782 Mixed hyperlipidemia: Secondary | ICD-10-CM | POA: Diagnosis not present

## 2022-03-22 DIAGNOSIS — G4700 Insomnia, unspecified: Secondary | ICD-10-CM | POA: Diagnosis not present

## 2022-03-22 DIAGNOSIS — I129 Hypertensive chronic kidney disease with stage 1 through stage 4 chronic kidney disease, or unspecified chronic kidney disease: Secondary | ICD-10-CM | POA: Diagnosis not present

## 2022-03-22 DIAGNOSIS — D509 Iron deficiency anemia, unspecified: Secondary | ICD-10-CM | POA: Diagnosis not present

## 2022-03-22 DIAGNOSIS — E039 Hypothyroidism, unspecified: Secondary | ICD-10-CM | POA: Diagnosis not present

## 2022-03-22 DIAGNOSIS — R7301 Impaired fasting glucose: Secondary | ICD-10-CM | POA: Diagnosis not present

## 2022-03-22 DIAGNOSIS — D696 Thrombocytopenia, unspecified: Secondary | ICD-10-CM | POA: Diagnosis not present

## 2022-03-22 DIAGNOSIS — I10 Essential (primary) hypertension: Secondary | ICD-10-CM | POA: Diagnosis not present

## 2022-04-17 DIAGNOSIS — N200 Calculus of kidney: Secondary | ICD-10-CM | POA: Diagnosis not present

## 2022-04-17 DIAGNOSIS — I129 Hypertensive chronic kidney disease with stage 1 through stage 4 chronic kidney disease, or unspecified chronic kidney disease: Secondary | ICD-10-CM | POA: Diagnosis not present

## 2022-04-17 DIAGNOSIS — D696 Thrombocytopenia, unspecified: Secondary | ICD-10-CM | POA: Diagnosis not present

## 2022-04-17 DIAGNOSIS — N1832 Chronic kidney disease, stage 3b: Secondary | ICD-10-CM | POA: Diagnosis not present

## 2022-04-26 ENCOUNTER — Ambulatory Visit (HOSPITAL_COMMUNITY)
Admission: RE | Admit: 2022-04-26 | Discharge: 2022-04-26 | Disposition: A | Discharge status: Home / Self Care | Payer: Medicare Other | Source: Ambulatory Visit | Attending: Nephrology | Admitting: Nephrology

## 2022-04-26 DIAGNOSIS — I959 Hypotension, unspecified: Secondary | ICD-10-CM | POA: Diagnosis not present

## 2022-04-26 DIAGNOSIS — E876 Hypokalemia: Secondary | ICD-10-CM | POA: Diagnosis not present

## 2022-04-26 DIAGNOSIS — D696 Thrombocytopenia, unspecified: Secondary | ICD-10-CM | POA: Diagnosis not present

## 2022-04-26 DIAGNOSIS — R001 Bradycardia, unspecified: Secondary | ICD-10-CM | POA: Diagnosis not present

## 2022-04-26 DIAGNOSIS — N200 Calculus of kidney: Secondary | ICD-10-CM | POA: Diagnosis not present

## 2022-04-26 DIAGNOSIS — I129 Hypertensive chronic kidney disease with stage 1 through stage 4 chronic kidney disease, or unspecified chronic kidney disease: Secondary | ICD-10-CM | POA: Diagnosis not present

## 2022-04-26 DIAGNOSIS — Z8679 Personal history of other diseases of the circulatory system: Secondary | ICD-10-CM | POA: Diagnosis not present

## 2022-04-26 DIAGNOSIS — N1832 Chronic kidney disease, stage 3b: Secondary | ICD-10-CM | POA: Diagnosis not present

## 2022-06-22 DIAGNOSIS — M542 Cervicalgia: Secondary | ICD-10-CM | POA: Diagnosis not present

## 2022-06-22 DIAGNOSIS — I129 Hypertensive chronic kidney disease with stage 1 through stage 4 chronic kidney disease, or unspecified chronic kidney disease: Secondary | ICD-10-CM | POA: Diagnosis not present

## 2022-06-22 DIAGNOSIS — G4700 Insomnia, unspecified: Secondary | ICD-10-CM | POA: Diagnosis not present

## 2022-07-19 DIAGNOSIS — I959 Hypotension, unspecified: Secondary | ICD-10-CM | POA: Diagnosis not present

## 2022-07-19 DIAGNOSIS — N200 Calculus of kidney: Secondary | ICD-10-CM | POA: Diagnosis not present

## 2022-07-19 DIAGNOSIS — N1832 Chronic kidney disease, stage 3b: Secondary | ICD-10-CM | POA: Diagnosis not present

## 2022-07-19 DIAGNOSIS — D696 Thrombocytopenia, unspecified: Secondary | ICD-10-CM | POA: Diagnosis not present

## 2022-07-19 DIAGNOSIS — E876 Hypokalemia: Secondary | ICD-10-CM | POA: Diagnosis not present

## 2022-07-27 DIAGNOSIS — N200 Calculus of kidney: Secondary | ICD-10-CM | POA: Diagnosis not present

## 2022-07-27 DIAGNOSIS — I129 Hypertensive chronic kidney disease with stage 1 through stage 4 chronic kidney disease, or unspecified chronic kidney disease: Secondary | ICD-10-CM | POA: Diagnosis not present

## 2022-07-27 DIAGNOSIS — E876 Hypokalemia: Secondary | ICD-10-CM | POA: Diagnosis not present

## 2022-07-27 DIAGNOSIS — N1832 Chronic kidney disease, stage 3b: Secondary | ICD-10-CM | POA: Diagnosis not present

## 2022-08-24 DIAGNOSIS — Z1211 Encounter for screening for malignant neoplasm of colon: Secondary | ICD-10-CM | POA: Diagnosis not present

## 2022-09-18 DIAGNOSIS — E039 Hypothyroidism, unspecified: Secondary | ICD-10-CM | POA: Diagnosis not present

## 2022-09-18 DIAGNOSIS — E782 Mixed hyperlipidemia: Secondary | ICD-10-CM | POA: Diagnosis not present

## 2022-09-18 DIAGNOSIS — Z23 Encounter for immunization: Secondary | ICD-10-CM | POA: Diagnosis not present

## 2022-09-18 DIAGNOSIS — R7301 Impaired fasting glucose: Secondary | ICD-10-CM | POA: Diagnosis not present

## 2022-09-25 DIAGNOSIS — E039 Hypothyroidism, unspecified: Secondary | ICD-10-CM | POA: Diagnosis not present

## 2022-09-25 DIAGNOSIS — M542 Cervicalgia: Secondary | ICD-10-CM | POA: Diagnosis not present

## 2022-09-25 DIAGNOSIS — Z0000 Encounter for general adult medical examination without abnormal findings: Secondary | ICD-10-CM | POA: Diagnosis not present

## 2022-09-25 DIAGNOSIS — I10 Essential (primary) hypertension: Secondary | ICD-10-CM | POA: Diagnosis not present

## 2022-09-25 DIAGNOSIS — G4700 Insomnia, unspecified: Secondary | ICD-10-CM | POA: Diagnosis not present

## 2022-09-25 DIAGNOSIS — E782 Mixed hyperlipidemia: Secondary | ICD-10-CM | POA: Diagnosis not present

## 2022-09-25 DIAGNOSIS — D696 Thrombocytopenia, unspecified: Secondary | ICD-10-CM | POA: Diagnosis not present

## 2022-09-25 DIAGNOSIS — R7301 Impaired fasting glucose: Secondary | ICD-10-CM | POA: Diagnosis not present

## 2022-09-25 DIAGNOSIS — D509 Iron deficiency anemia, unspecified: Secondary | ICD-10-CM | POA: Diagnosis not present

## 2022-09-25 DIAGNOSIS — I129 Hypertensive chronic kidney disease with stage 1 through stage 4 chronic kidney disease, or unspecified chronic kidney disease: Secondary | ICD-10-CM | POA: Diagnosis not present

## 2022-09-28 DIAGNOSIS — I129 Hypertensive chronic kidney disease with stage 1 through stage 4 chronic kidney disease, or unspecified chronic kidney disease: Secondary | ICD-10-CM | POA: Diagnosis not present

## 2022-09-28 DIAGNOSIS — N200 Calculus of kidney: Secondary | ICD-10-CM | POA: Diagnosis not present

## 2022-09-28 DIAGNOSIS — N1832 Chronic kidney disease, stage 3b: Secondary | ICD-10-CM | POA: Diagnosis not present

## 2022-09-28 DIAGNOSIS — D696 Thrombocytopenia, unspecified: Secondary | ICD-10-CM | POA: Diagnosis not present

## 2022-10-23 DIAGNOSIS — H401114 Primary open-angle glaucoma, right eye, indeterminate stage: Secondary | ICD-10-CM | POA: Diagnosis not present

## 2022-10-23 DIAGNOSIS — H401124 Primary open-angle glaucoma, left eye, indeterminate stage: Secondary | ICD-10-CM | POA: Diagnosis not present

## 2023-01-25 DIAGNOSIS — N1832 Chronic kidney disease, stage 3b: Secondary | ICD-10-CM | POA: Diagnosis not present

## 2023-01-25 DIAGNOSIS — N200 Calculus of kidney: Secondary | ICD-10-CM | POA: Diagnosis not present

## 2023-01-25 DIAGNOSIS — I129 Hypertensive chronic kidney disease with stage 1 through stage 4 chronic kidney disease, or unspecified chronic kidney disease: Secondary | ICD-10-CM | POA: Diagnosis not present

## 2023-01-25 DIAGNOSIS — D696 Thrombocytopenia, unspecified: Secondary | ICD-10-CM | POA: Diagnosis not present

## 2023-02-01 DIAGNOSIS — E876 Hypokalemia: Secondary | ICD-10-CM | POA: Diagnosis not present

## 2023-02-01 DIAGNOSIS — I129 Hypertensive chronic kidney disease with stage 1 through stage 4 chronic kidney disease, or unspecified chronic kidney disease: Secondary | ICD-10-CM | POA: Diagnosis not present

## 2023-02-01 DIAGNOSIS — D696 Thrombocytopenia, unspecified: Secondary | ICD-10-CM | POA: Diagnosis not present

## 2023-02-01 DIAGNOSIS — N1832 Chronic kidney disease, stage 3b: Secondary | ICD-10-CM | POA: Diagnosis not present

## 2023-03-21 DIAGNOSIS — E782 Mixed hyperlipidemia: Secondary | ICD-10-CM | POA: Diagnosis not present

## 2023-03-21 DIAGNOSIS — R7301 Impaired fasting glucose: Secondary | ICD-10-CM | POA: Diagnosis not present

## 2023-03-21 DIAGNOSIS — E039 Hypothyroidism, unspecified: Secondary | ICD-10-CM | POA: Diagnosis not present

## 2023-03-26 DIAGNOSIS — Z0000 Encounter for general adult medical examination without abnormal findings: Secondary | ICD-10-CM | POA: Diagnosis not present

## 2023-03-27 DIAGNOSIS — R7301 Impaired fasting glucose: Secondary | ICD-10-CM | POA: Diagnosis not present

## 2023-03-27 DIAGNOSIS — D509 Iron deficiency anemia, unspecified: Secondary | ICD-10-CM | POA: Diagnosis not present

## 2023-03-27 DIAGNOSIS — E782 Mixed hyperlipidemia: Secondary | ICD-10-CM | POA: Diagnosis not present

## 2023-03-27 DIAGNOSIS — G4700 Insomnia, unspecified: Secondary | ICD-10-CM | POA: Diagnosis not present

## 2023-03-27 DIAGNOSIS — D696 Thrombocytopenia, unspecified: Secondary | ICD-10-CM | POA: Diagnosis not present

## 2023-03-27 DIAGNOSIS — E038 Other specified hypothyroidism: Secondary | ICD-10-CM | POA: Diagnosis not present

## 2023-03-27 DIAGNOSIS — M542 Cervicalgia: Secondary | ICD-10-CM | POA: Diagnosis not present

## 2023-03-27 DIAGNOSIS — M109 Gout, unspecified: Secondary | ICD-10-CM | POA: Diagnosis not present

## 2023-03-27 DIAGNOSIS — I10 Essential (primary) hypertension: Secondary | ICD-10-CM | POA: Diagnosis not present

## 2023-03-27 DIAGNOSIS — I129 Hypertensive chronic kidney disease with stage 1 through stage 4 chronic kidney disease, or unspecified chronic kidney disease: Secondary | ICD-10-CM | POA: Diagnosis not present

## 2023-04-02 DIAGNOSIS — H401123 Primary open-angle glaucoma, left eye, severe stage: Secondary | ICD-10-CM | POA: Diagnosis not present

## 2023-04-02 DIAGNOSIS — H401111 Primary open-angle glaucoma, right eye, mild stage: Secondary | ICD-10-CM | POA: Diagnosis not present

## 2023-07-23 DIAGNOSIS — I129 Hypertensive chronic kidney disease with stage 1 through stage 4 chronic kidney disease, or unspecified chronic kidney disease: Secondary | ICD-10-CM | POA: Diagnosis not present

## 2023-07-23 DIAGNOSIS — R809 Proteinuria, unspecified: Secondary | ICD-10-CM | POA: Diagnosis not present

## 2023-07-23 DIAGNOSIS — D631 Anemia in chronic kidney disease: Secondary | ICD-10-CM | POA: Diagnosis not present

## 2023-07-23 DIAGNOSIS — N1832 Chronic kidney disease, stage 3b: Secondary | ICD-10-CM | POA: Diagnosis not present

## 2023-08-02 DIAGNOSIS — G4700 Insomnia, unspecified: Secondary | ICD-10-CM | POA: Diagnosis not present

## 2023-08-02 DIAGNOSIS — I10 Essential (primary) hypertension: Secondary | ICD-10-CM | POA: Diagnosis not present

## 2023-08-02 DIAGNOSIS — I129 Hypertensive chronic kidney disease with stage 1 through stage 4 chronic kidney disease, or unspecified chronic kidney disease: Secondary | ICD-10-CM | POA: Diagnosis not present

## 2023-08-02 DIAGNOSIS — Z6820 Body mass index (BMI) 20.0-20.9, adult: Secondary | ICD-10-CM | POA: Diagnosis not present

## 2023-08-02 DIAGNOSIS — E440 Moderate protein-calorie malnutrition: Secondary | ICD-10-CM | POA: Diagnosis not present

## 2023-08-02 DIAGNOSIS — R031 Nonspecific low blood-pressure reading: Secondary | ICD-10-CM | POA: Diagnosis not present

## 2023-08-02 DIAGNOSIS — H401130 Primary open-angle glaucoma, bilateral, stage unspecified: Secondary | ICD-10-CM | POA: Diagnosis not present

## 2023-08-02 DIAGNOSIS — Z713 Dietary counseling and surveillance: Secondary | ICD-10-CM | POA: Diagnosis not present

## 2023-08-02 DIAGNOSIS — N1832 Chronic kidney disease, stage 3b: Secondary | ICD-10-CM | POA: Diagnosis not present

## 2023-08-02 DIAGNOSIS — N200 Calculus of kidney: Secondary | ICD-10-CM | POA: Diagnosis not present

## 2023-08-02 DIAGNOSIS — Z79899 Other long term (current) drug therapy: Secondary | ICD-10-CM | POA: Diagnosis not present

## 2023-08-02 DIAGNOSIS — Z7182 Exercise counseling: Secondary | ICD-10-CM | POA: Diagnosis not present

## 2023-08-16 DIAGNOSIS — H401123 Primary open-angle glaucoma, left eye, severe stage: Secondary | ICD-10-CM | POA: Diagnosis not present

## 2023-08-16 DIAGNOSIS — H401111 Primary open-angle glaucoma, right eye, mild stage: Secondary | ICD-10-CM | POA: Diagnosis not present

## 2023-08-22 ENCOUNTER — Inpatient Hospital Stay (HOSPITAL_COMMUNITY): Payer: Medicare Other

## 2023-08-22 ENCOUNTER — Emergency Department (HOSPITAL_COMMUNITY): Payer: Medicare Other

## 2023-08-22 ENCOUNTER — Inpatient Hospital Stay (HOSPITAL_COMMUNITY)
Admission: EM | Admit: 2023-08-22 | Discharge: 2023-09-25 | DRG: 913 | Disposition: E | Payer: Medicare Other | Attending: Internal Medicine | Admitting: Internal Medicine

## 2023-08-22 ENCOUNTER — Other Ambulatory Visit: Payer: Self-pay

## 2023-08-22 ENCOUNTER — Encounter (HOSPITAL_COMMUNITY): Payer: Self-pay

## 2023-08-22 DIAGNOSIS — N1831 Chronic kidney disease, stage 3a: Secondary | ICD-10-CM | POA: Diagnosis not present

## 2023-08-22 DIAGNOSIS — E039 Hypothyroidism, unspecified: Secondary | ICD-10-CM | POA: Diagnosis not present

## 2023-08-22 DIAGNOSIS — T1491XA Suicide attempt, initial encounter: Principal | ICD-10-CM | POA: Diagnosis present

## 2023-08-22 DIAGNOSIS — E871 Hypo-osmolality and hyponatremia: Secondary | ICD-10-CM | POA: Diagnosis present

## 2023-08-22 DIAGNOSIS — R6521 Severe sepsis with septic shock: Secondary | ICD-10-CM | POA: Diagnosis present

## 2023-08-22 DIAGNOSIS — Z66 Do not resuscitate: Secondary | ICD-10-CM | POA: Diagnosis not present

## 2023-08-22 DIAGNOSIS — Z79890 Hormone replacement therapy (postmenopausal): Secondary | ICD-10-CM | POA: Diagnosis not present

## 2023-08-22 DIAGNOSIS — A419 Sepsis, unspecified organism: Secondary | ICD-10-CM | POA: Diagnosis present

## 2023-08-22 DIAGNOSIS — E43 Unspecified severe protein-calorie malnutrition: Secondary | ICD-10-CM | POA: Diagnosis not present

## 2023-08-22 DIAGNOSIS — J690 Pneumonitis due to inhalation of food and vomit: Secondary | ICD-10-CM | POA: Diagnosis present

## 2023-08-22 DIAGNOSIS — G253 Myoclonus: Secondary | ICD-10-CM | POA: Diagnosis present

## 2023-08-22 DIAGNOSIS — T751XXA Unspecified effects of drowning and nonfatal submersion, initial encounter: Principal | ICD-10-CM

## 2023-08-22 DIAGNOSIS — J9601 Acute respiratory failure with hypoxia: Secondary | ICD-10-CM | POA: Diagnosis present

## 2023-08-22 DIAGNOSIS — G931 Anoxic brain damage, not elsewhere classified: Secondary | ICD-10-CM

## 2023-08-22 DIAGNOSIS — E872 Acidosis, unspecified: Secondary | ICD-10-CM | POA: Diagnosis not present

## 2023-08-22 DIAGNOSIS — R000 Tachycardia, unspecified: Secondary | ICD-10-CM | POA: Diagnosis not present

## 2023-08-22 DIAGNOSIS — R0989 Other specified symptoms and signs involving the circulatory and respiratory systems: Secondary | ICD-10-CM | POA: Diagnosis not present

## 2023-08-22 DIAGNOSIS — E876 Hypokalemia: Secondary | ICD-10-CM | POA: Diagnosis present

## 2023-08-22 DIAGNOSIS — I129 Hypertensive chronic kidney disease with stage 1 through stage 4 chronic kidney disease, or unspecified chronic kidney disease: Secondary | ICD-10-CM | POA: Diagnosis present

## 2023-08-22 DIAGNOSIS — R140 Abdominal distension (gaseous): Secondary | ICD-10-CM | POA: Diagnosis not present

## 2023-08-22 DIAGNOSIS — S3991XA Unspecified injury of abdomen, initial encounter: Secondary | ICD-10-CM | POA: Diagnosis not present

## 2023-08-22 DIAGNOSIS — I469 Cardiac arrest, cause unspecified: Secondary | ICD-10-CM | POA: Diagnosis not present

## 2023-08-22 DIAGNOSIS — Z452 Encounter for adjustment and management of vascular access device: Secondary | ICD-10-CM | POA: Diagnosis not present

## 2023-08-22 DIAGNOSIS — N179 Acute kidney failure, unspecified: Secondary | ICD-10-CM | POA: Diagnosis not present

## 2023-08-22 DIAGNOSIS — Z681 Body mass index (BMI) 19 or less, adult: Secondary | ICD-10-CM | POA: Diagnosis not present

## 2023-08-22 DIAGNOSIS — R739 Hyperglycemia, unspecified: Secondary | ICD-10-CM | POA: Diagnosis present

## 2023-08-22 DIAGNOSIS — J329 Chronic sinusitis, unspecified: Secondary | ICD-10-CM | POA: Diagnosis not present

## 2023-08-22 DIAGNOSIS — S3993XA Unspecified injury of pelvis, initial encounter: Secondary | ICD-10-CM | POA: Diagnosis not present

## 2023-08-22 DIAGNOSIS — F32A Depression, unspecified: Secondary | ICD-10-CM | POA: Diagnosis present

## 2023-08-22 DIAGNOSIS — X713XXA Intentional self-harm by drowning and submersion in natural water, initial encounter: Secondary | ICD-10-CM

## 2023-08-22 DIAGNOSIS — I4891 Unspecified atrial fibrillation: Secondary | ICD-10-CM | POA: Diagnosis not present

## 2023-08-22 DIAGNOSIS — N170 Acute kidney failure with tubular necrosis: Secondary | ICD-10-CM | POA: Diagnosis not present

## 2023-08-22 DIAGNOSIS — R569 Unspecified convulsions: Secondary | ICD-10-CM | POA: Diagnosis not present

## 2023-08-22 DIAGNOSIS — G40409 Other generalized epilepsy and epileptic syndromes, not intractable, without status epilepticus: Secondary | ICD-10-CM | POA: Diagnosis not present

## 2023-08-22 DIAGNOSIS — J929 Pleural plaque without asbestos: Secondary | ICD-10-CM | POA: Diagnosis not present

## 2023-08-22 DIAGNOSIS — S299XXA Unspecified injury of thorax, initial encounter: Secondary | ICD-10-CM | POA: Diagnosis not present

## 2023-08-22 DIAGNOSIS — Z4682 Encounter for fitting and adjustment of non-vascular catheter: Secondary | ICD-10-CM | POA: Diagnosis not present

## 2023-08-22 DIAGNOSIS — R918 Other nonspecific abnormal finding of lung field: Secondary | ICD-10-CM | POA: Diagnosis not present

## 2023-08-22 DIAGNOSIS — M5136 Other intervertebral disc degeneration, lumbar region: Secondary | ICD-10-CM | POA: Diagnosis not present

## 2023-08-22 DIAGNOSIS — J811 Chronic pulmonary edema: Secondary | ICD-10-CM | POA: Diagnosis not present

## 2023-08-22 DIAGNOSIS — J439 Emphysema, unspecified: Secondary | ICD-10-CM | POA: Diagnosis not present

## 2023-08-22 DIAGNOSIS — R570 Cardiogenic shock: Secondary | ICD-10-CM | POA: Diagnosis not present

## 2023-08-22 DIAGNOSIS — I499 Cardiac arrhythmia, unspecified: Secondary | ICD-10-CM | POA: Diagnosis not present

## 2023-08-22 DIAGNOSIS — G936 Cerebral edema: Secondary | ICD-10-CM | POA: Diagnosis not present

## 2023-08-22 DIAGNOSIS — Z515 Encounter for palliative care: Secondary | ICD-10-CM

## 2023-08-22 DIAGNOSIS — Z79899 Other long term (current) drug therapy: Secondary | ICD-10-CM

## 2023-08-22 DIAGNOSIS — Z743 Need for continuous supervision: Secondary | ICD-10-CM | POA: Diagnosis not present

## 2023-08-22 HISTORY — DX: Unspecified cataract: H26.9

## 2023-08-22 HISTORY — DX: Essential (primary) hypertension: I10

## 2023-08-22 HISTORY — DX: Chronic kidney disease, unspecified: N18.9

## 2023-08-22 LAB — PROTIME-INR
INR: 1.4 — ABNORMAL HIGH (ref 0.8–1.2)
Prothrombin Time: 17.6 s — ABNORMAL HIGH (ref 11.4–15.2)

## 2023-08-22 LAB — URINALYSIS, ROUTINE W REFLEX MICROSCOPIC
Bacteria, UA: NONE SEEN
Bilirubin Urine: NEGATIVE
Glucose, UA: 150 mg/dL — AB
Ketones, ur: NEGATIVE mg/dL
Leukocytes,Ua: NEGATIVE
Nitrite: NEGATIVE
Protein, ur: 30 mg/dL — AB
Specific Gravity, Urine: 1.02 (ref 1.005–1.030)
pH: 8 (ref 5.0–8.0)

## 2023-08-22 LAB — CBC WITH DIFFERENTIAL/PLATELET
Abs Immature Granulocytes: 1.07 10*3/uL — ABNORMAL HIGH (ref 0.00–0.07)
Basophils Absolute: 0.1 10*3/uL (ref 0.0–0.1)
Basophils Relative: 1 %
Eosinophils Absolute: 0 10*3/uL (ref 0.0–0.5)
Eosinophils Relative: 0 %
HCT: 45 % (ref 39.0–52.0)
Hemoglobin: 14.8 g/dL (ref 13.0–17.0)
Immature Granulocytes: 7 %
Lymphocytes Relative: 9 %
Lymphs Abs: 1.4 10*3/uL (ref 0.7–4.0)
MCH: 29.7 pg (ref 26.0–34.0)
MCHC: 32.9 g/dL (ref 30.0–36.0)
MCV: 90.4 fL (ref 80.0–100.0)
Monocytes Absolute: 0.2 10*3/uL (ref 0.1–1.0)
Monocytes Relative: 2 %
Neutro Abs: 12.2 10*3/uL — ABNORMAL HIGH (ref 1.7–7.7)
Neutrophils Relative %: 81 %
Platelets: 158 10*3/uL (ref 150–400)
RBC: 4.98 MIL/uL (ref 4.22–5.81)
RDW: 11.9 % (ref 11.5–15.5)
WBC: 14.9 10*3/uL — ABNORMAL HIGH (ref 4.0–10.5)
nRBC: 0 % (ref 0.0–0.2)

## 2023-08-22 LAB — COMPREHENSIVE METABOLIC PANEL
ALT: 28 U/L (ref 0–44)
AST: 47 U/L — ABNORMAL HIGH (ref 15–41)
Albumin: 2.9 g/dL — ABNORMAL LOW (ref 3.5–5.0)
Alkaline Phosphatase: 65 U/L (ref 38–126)
Anion gap: 20 — ABNORMAL HIGH (ref 5–15)
BUN: 23 mg/dL (ref 8–23)
CO2: 19 mmol/L — ABNORMAL LOW (ref 22–32)
Calcium: 8.3 mg/dL — ABNORMAL LOW (ref 8.9–10.3)
Chloride: 95 mmol/L — ABNORMAL LOW (ref 98–111)
Creatinine, Ser: 1.8 mg/dL — ABNORMAL HIGH (ref 0.61–1.24)
GFR, Estimated: 39 mL/min — ABNORMAL LOW (ref >60)
Glucose, Bld: 212 mg/dL — ABNORMAL HIGH (ref 70–99)
Potassium: 3.3 mmol/L — ABNORMAL LOW (ref 3.5–5.1)
Sodium: 134 mmol/L — ABNORMAL LOW (ref 135–145)
Total Bilirubin: 1.3 mg/dL — ABNORMAL HIGH (ref 0.3–1.2)
Total Protein: 5.5 g/dL — ABNORMAL LOW (ref 6.5–8.1)

## 2023-08-22 LAB — I-STAT ARTERIAL BLOOD GAS, ED
Acid-base deficit: 4 mmol/L — ABNORMAL HIGH (ref 0.0–2.0)
Acid-base deficit: 3 mmol/L — ABNORMAL HIGH (ref 0.0–2.0)
Bicarbonate: 24.9 mmol/L (ref 20.0–28.0)
Bicarbonate: 24 mmol/L (ref 20.0–28.0)
Calcium, Ion: 1.13 mmol/L — ABNORMAL LOW (ref 1.15–1.40)
Calcium, Ion: 1.09 mmol/L — ABNORMAL LOW (ref 1.15–1.40)
HCT: 45 % (ref 39.0–52.0)
HCT: 40 % (ref 39.0–52.0)
Hemoglobin: 15.3 g/dL (ref 13.0–17.0)
Hemoglobin: 13.6 g/dL (ref 13.0–17.0)
O2 Saturation: 100 %
O2 Saturation: 99 %
Patient temperature: 95.9
Patient temperature: 95.9
Potassium: 2.9 mmol/L — ABNORMAL LOW (ref 3.5–5.1)
Potassium: 2.6 mmol/L — CL (ref 3.5–5.1)
Sodium: 135 mmol/L (ref 135–145)
Sodium: 135 mmol/L (ref 135–145)
TCO2: 27 mmol/L (ref 22–32)
TCO2: 26 mmol/L (ref 22–32)
pCO2 arterial: 57.5 mmHg — ABNORMAL HIGH (ref 32–48)
pCO2 arterial: 47.9 mmHg (ref 32–48)
pH, Arterial: 7.236 — ABNORMAL LOW (ref 7.35–7.45)
pH, Arterial: 7.3 — ABNORMAL LOW (ref 7.35–7.45)
pO2, Arterial: 207 mmHg — ABNORMAL HIGH (ref 83–108)
pO2, Arterial: 137 mmHg — ABNORMAL HIGH (ref 83–108)

## 2023-08-22 LAB — GLUCOSE, CAPILLARY
Glucose-Capillary: 174 mg/dL — ABNORMAL HIGH (ref 70–99)
Glucose-Capillary: 192 mg/dL — ABNORMAL HIGH (ref 70–99)
Glucose-Capillary: 204 mg/dL — ABNORMAL HIGH (ref 70–99)

## 2023-08-22 LAB — MAGNESIUM: Magnesium: 2.1 mg/dL (ref 1.7–2.4)

## 2023-08-22 LAB — TYPE AND SCREEN
ABO/RH(D): A POS
Antibody Screen: NEGATIVE

## 2023-08-22 LAB — PHOSPHORUS: Phosphorus: 6.1 mg/dL — ABNORMAL HIGH (ref 2.5–4.6)

## 2023-08-22 LAB — CK TOTAL AND CKMB (NOT AT ARMC)
CK, MB: 3.9 ng/mL (ref 0.5–5.0)
Total CK: 183 U/L (ref 49–397)

## 2023-08-22 LAB — MRSA NEXT GEN BY PCR, NASAL: MRSA by PCR Next Gen: NOT DETECTED

## 2023-08-22 LAB — ECHOCARDIOGRAM COMPLETE
Area-P 1/2: 3.12 cm2
Height: 72 in
S' Lateral: 2.3 cm
Weight: 2539.7 [oz_av]

## 2023-08-22 LAB — I-STAT CG4 LACTIC ACID, ED: Lactic Acid, Venous: 7.8 mmol/L (ref 0.5–1.9)

## 2023-08-22 LAB — RAPID URINE DRUG SCREEN, HOSP PERFORMED
Amphetamines: NOT DETECTED
Barbiturates: NOT DETECTED
Benzodiazepines: POSITIVE — AB
Cocaine: NOT DETECTED
Opiates: NOT DETECTED
Tetrahydrocannabinol: NOT DETECTED

## 2023-08-22 LAB — ABO/RH: ABO/RH(D): A POS

## 2023-08-22 LAB — APTT: aPTT: 33 s (ref 24–36)

## 2023-08-22 LAB — ETHANOL: Alcohol, Ethyl (B): <10 mg/dL (ref <10)

## 2023-08-22 MED ORDER — DOCUSATE SODIUM 50 MG/5ML PO LIQD: 100.0000 mg | Freq: Two times a day (BID) | ORAL | Status: DC | PRN

## 2023-08-22 MED ORDER — PANTOPRAZOLE SODIUM 40 MG IV SOLR
40.0000 mg | INTRAVENOUS | Status: DC
  Administered 2023-08-22 – 2023-08-25 (×4): 40 mg via INTRAVENOUS
  Filled 2023-08-22 (×4): qty 10

## 2023-08-22 MED ORDER — LORAZEPAM 2 MG/ML IJ SOLN
INTRAMUSCULAR | Status: AC
  Administered 2023-08-22: 4 mg via INTRAVENOUS
  Filled 2023-08-22: qty 2

## 2023-08-22 MED ORDER — LEVETIRACETAM IN NACL 1000 MG/100ML IV SOLN
1000.0000 mg | Freq: Once | INTRAVENOUS | Status: DC
  Filled 2023-08-22: qty 100

## 2023-08-22 MED ORDER — ACETAMINOPHEN 650 MG RE SUPP: 650.0000 mg | RECTAL | Status: DC

## 2023-08-22 MED ORDER — NOREPINEPHRINE 4 MG/250ML-% IV SOLN
0.0000 ug/min | INTRAVENOUS | Status: DC
  Administered 2023-08-22: 24 ug/min via INTRAVENOUS
  Administered 2023-08-22: 20 ug/min via INTRAVENOUS
  Administered 2023-08-22: 15 ug/min via INTRAVENOUS
  Administered 2023-08-22: 18 ug/min via INTRAVENOUS
  Administered 2023-08-23 (×2): 17 ug/min via INTRAVENOUS
  Administered 2023-08-23: 8 ug/min via INTRAVENOUS
  Filled 2023-08-22 (×6): qty 250

## 2023-08-22 MED ORDER — MIDAZOLAM-SODIUM CHLORIDE 100-0.9 MG/100ML-% IV SOLN
0.0000 mg/h | INTRAVENOUS | Status: DC
  Administered 2023-08-22 (×2): 10 mg/h via INTRAVENOUS
  Administered 2023-08-23: 9 mg/h via INTRAVENOUS
  Filled 2023-08-22 (×3): qty 100

## 2023-08-22 MED ORDER — CHLORHEXIDINE GLUCONATE CLOTH 2 % EX PADS
6.0000 | MEDICATED_PAD | Freq: Every day | CUTANEOUS | Status: DC
  Administered 2023-08-23: 6 via TOPICAL

## 2023-08-22 MED ORDER — CHLORHEXIDINE GLUCONATE CLOTH 2 % EX PADS
6.0000 | MEDICATED_PAD | Freq: Every day | CUTANEOUS | Status: DC
  Administered 2023-08-22 – 2023-08-25 (×4): 6 via TOPICAL

## 2023-08-22 MED ORDER — NOREPINEPHRINE 4 MG/250ML-% IV SOLN: 2.0000 ug/min | INTRAVENOUS | Status: DC

## 2023-08-22 MED ORDER — SODIUM CHLORIDE 0.9 % IV SOLN: INTRAVENOUS | Status: DC | PRN

## 2023-08-22 MED ORDER — ACETAMINOPHEN 160 MG/5ML PO SOLN: 650.0000 mg | ORAL | Status: DC

## 2023-08-22 MED ORDER — POTASSIUM CHLORIDE 20 MEQ PO PACK
40.0000 meq | PACK | Freq: Once | ORAL | Status: AC
  Administered 2023-08-22: 40 meq
  Filled 2023-08-22: qty 2

## 2023-08-22 MED ORDER — IOHEXOL 350 MG/ML SOLN
75.0000 mL | Freq: Once | INTRAVENOUS | Status: AC | PRN
  Administered 2023-08-22: 75 mL via INTRAVENOUS

## 2023-08-22 MED ORDER — BUSPIRONE HCL 15 MG PO TABS: 30.0000 mg | ORAL_TABLET | Freq: Three times a day (TID) | ORAL | Status: AC | PRN

## 2023-08-22 MED ORDER — ACETAMINOPHEN 650 MG RE SUPP: 650.0000 mg | RECTAL | Status: DC | PRN

## 2023-08-22 MED ORDER — DOCUSATE SODIUM 50 MG/5ML PO LIQD: 100.0000 mg | Freq: Two times a day (BID) | ORAL | Status: DC

## 2023-08-22 MED ORDER — SODIUM CHLORIDE 0.9 % IV SOLN
4500.0000 mg | Freq: Once | INTRAVENOUS | Status: DC
  Filled 2023-08-22: qty 45

## 2023-08-22 MED ORDER — PIPERACILLIN-TAZOBACTAM 3.375 G IVPB
3.3750 g | Freq: Three times a day (TID) | INTRAVENOUS | Status: DC
  Administered 2023-08-22 – 2023-08-24 (×5): 3.375 g via INTRAVENOUS
  Filled 2023-08-22 (×5): qty 50

## 2023-08-22 MED ORDER — CALCIUM GLUCONATE-NACL 1-0.675 GM/50ML-% IV SOLN
1.0000 g | Freq: Once | INTRAVENOUS | Status: AC
  Administered 2023-08-22: 1000 mg via INTRAVENOUS
  Filled 2023-08-22: qty 50

## 2023-08-22 MED ORDER — SODIUM CHLORIDE 0.9 % IV SOLN: 250.0000 mL | INTRAVENOUS | Status: DC

## 2023-08-22 MED ORDER — POLYETHYLENE GLYCOL 3350 17 G PO PACK: 17.0000 g | PACK | Freq: Every day | ORAL | Status: DC

## 2023-08-22 MED ORDER — HEPARIN SODIUM (PORCINE) 5000 UNIT/ML IJ SOLN: 5000.0000 [IU] | Freq: Three times a day (TID) | INTRAMUSCULAR | Status: DC

## 2023-08-22 MED ORDER — POLYETHYLENE GLYCOL 3350 17 G PO PACK: 17.0000 g | PACK | Freq: Every day | ORAL | Status: DC | PRN

## 2023-08-22 MED ORDER — LEVETIRACETAM IN NACL 1000 MG/100ML IV SOLN
1000.0000 mg | INTRAVENOUS | Status: AC
  Administered 2023-08-22 (×4): 1000 mg via INTRAVENOUS
  Filled 2023-08-22 (×4): qty 100

## 2023-08-22 MED ORDER — INSULIN ASPART 100 UNIT/ML IJ SOLN
0.0000 [IU] | INTRAMUSCULAR | Status: DC
  Administered 2023-08-22 – 2023-08-23 (×2): 3 [IU] via SUBCUTANEOUS
  Administered 2023-08-23: 2 [IU] via SUBCUTANEOUS
  Administered 2023-08-23 (×3): 3 [IU] via SUBCUTANEOUS
  Administered 2023-08-24 (×2): 2 [IU] via SUBCUTANEOUS
  Administered 2023-08-24 – 2023-08-25 (×3): 3 [IU] via SUBCUTANEOUS
  Administered 2023-08-25: 2 [IU] via SUBCUTANEOUS
  Administered 2023-08-25: 3 [IU] via SUBCUTANEOUS
  Administered 2023-08-25 – 2023-08-26 (×3): 2 [IU] via SUBCUTANEOUS

## 2023-08-22 MED ORDER — ORAL CARE MOUTH RINSE: 15.0000 mL | OROMUCOSAL | Status: DC | PRN

## 2023-08-22 MED ORDER — ACETAMINOPHEN 325 MG PO TABS: 650.0000 mg | ORAL_TABLET | ORAL | Status: DC

## 2023-08-22 MED ORDER — ACETAMINOPHEN 160 MG/5ML PO SOLN: 650.0000 mg | ORAL | Status: DC | PRN

## 2023-08-22 MED ORDER — LACTATED RINGERS IV SOLN: INTRAVENOUS | Status: DC

## 2023-08-22 MED ORDER — MIDAZOLAM HCL 2 MG/2ML IJ SOLN: 1.0000 mg | INTRAMUSCULAR | Status: DC | PRN

## 2023-08-22 MED ORDER — SODIUM CHLORIDE 0.9 % IV BOLUS
1000.0000 mL | Freq: Once | INTRAVENOUS | Status: AC
  Administered 2023-08-22: 1000 mL via INTRAVENOUS

## 2023-08-22 MED ORDER — VANCOMYCIN HCL 1500 MG/300ML IV SOLN
1500.0000 mg | Freq: Once | INTRAVENOUS | Status: AC
  Administered 2023-08-22: 1500 mg via INTRAVENOUS
  Filled 2023-08-22: qty 300

## 2023-08-22 MED ORDER — LORAZEPAM 2 MG/ML IJ SOLN: 4.0000 mg | Freq: Once | INTRAMUSCULAR | Status: AC

## 2023-08-22 MED ORDER — VASOPRESSIN 20 UNITS/100 ML INFUSION FOR SHOCK
0.0000 [IU]/min | INTRAVENOUS | Status: DC
  Administered 2023-08-22 – 2023-08-23 (×3): 0.03 [IU]/min via INTRAVENOUS
  Filled 2023-08-22 (×3): qty 100

## 2023-08-22 MED ORDER — ORAL CARE MOUTH RINSE: 15.0000 mL | OROMUCOSAL | Status: DC

## 2023-08-22 MED ORDER — ORAL CARE MOUTH RINSE
15.0000 mL | OROMUCOSAL | Status: DC
  Administered 2023-08-22 – 2023-08-23 (×4): 15 mL via OROMUCOSAL

## 2023-08-22 MED ORDER — FENTANYL CITRATE PF 50 MCG/ML IJ SOSY: 25.0000 ug | PREFILLED_SYRINGE | Freq: Once | INTRAMUSCULAR | Status: DC

## 2023-08-22 MED ORDER — FENTANYL BOLUS VIA INFUSION
25.0000 ug | INTRAVENOUS | Status: DC | PRN
  Administered 2023-08-22: 100 ug via INTRAVENOUS

## 2023-08-22 MED ORDER — PROPOFOL 1000 MG/100ML IV EMUL: 5.0000 ug/kg/min | INTRAVENOUS | Status: DC

## 2023-08-22 MED ORDER — ACETAMINOPHEN 325 MG PO TABS: 650.0000 mg | ORAL_TABLET | ORAL | Status: DC | PRN

## 2023-08-22 MED ORDER — ORAL CARE MOUTH RINSE
15.0000 mL | OROMUCOSAL | Status: DC
  Administered 2023-08-22 – 2023-08-26 (×44): 15 mL via OROMUCOSAL

## 2023-08-22 MED ORDER — PIVOT 1.5 CAL PO LIQD
1000.0000 mL | ORAL | Status: DC
  Administered 2023-08-22: 1000 mL

## 2023-08-22 MED ORDER — ONDANSETRON HCL 4 MG/2ML IJ SOLN: 4.0000 mg | Freq: Four times a day (QID) | INTRAMUSCULAR | Status: DC | PRN

## 2023-08-22 MED ORDER — FENTANYL 2500MCG IN NS 250ML (10MCG/ML) PREMIX INFUSION
25.0000 ug/h | INTRAVENOUS | Status: DC
  Administered 2023-08-22: 150 ug/h via INTRAVENOUS
  Filled 2023-08-22: qty 250

## 2023-08-22 MED ORDER — SODIUM CHLORIDE 0.9 % IV SOLN
750.0000 mg | Freq: Two times a day (BID) | INTRAVENOUS | Status: DC
  Administered 2023-08-22 – 2023-08-25 (×7): 750 mg via INTRAVENOUS
  Filled 2023-08-22 (×10): qty 7.5

## 2023-08-22 MED ORDER — PROPOFOL 1000 MG/100ML IV EMUL
INTRAVENOUS | Status: AC
  Administered 2023-08-22: 5 ug/kg/min via INTRAVENOUS
  Filled 2023-08-22: qty 100

## 2023-08-22 MED ORDER — PIPERACILLIN-TAZOBACTAM 3.375 G IVPB 30 MIN
3.3750 g | Freq: Once | INTRAVENOUS | Status: AC
  Administered 2023-08-22: 3.375 g via INTRAVENOUS
  Filled 2023-08-22: qty 50

## 2023-08-22 MED ORDER — HEPARIN SODIUM (PORCINE) 5000 UNIT/ML IJ SOLN
5000.0000 [IU] | Freq: Three times a day (TID) | INTRAMUSCULAR | Status: DC
  Administered 2023-08-22 – 2023-08-26 (×12): 5000 [IU] via SUBCUTANEOUS
  Filled 2023-08-22 (×12): qty 1

## 2023-08-22 MED ORDER — MAGNESIUM SULFATE 2 GM/50ML IV SOLN: 2.0000 g | Freq: Once | INTRAVENOUS | Status: AC | PRN

## 2023-08-22 MED ORDER — VANCOMYCIN HCL 2000 MG/400ML IV SOLN
2000.0000 mg | Freq: Once | INTRAVENOUS | Status: DC
Start: 2023-08-22 — End: 2023-08-22

## 2023-08-22 MED ORDER — PROPOFOL 1000 MG/100ML IV EMUL
40.0000 ug/kg/min | INTRAVENOUS | Status: DC
  Administered 2023-08-22: 80 ug/kg/min via INTRAVENOUS
  Administered 2023-08-22 – 2023-08-23 (×3): 40 ug/kg/min via INTRAVENOUS
  Filled 2023-08-22 (×4): qty 100

## 2023-08-22 MED ORDER — FENTANYL CITRATE PF 50 MCG/ML IJ SOSY
25.0000 ug | PREFILLED_SYRINGE | INTRAMUSCULAR | Status: DC | PRN
  Administered 2023-08-24: 50 ug via INTRAVENOUS
  Administered 2023-08-25 – 2023-08-26 (×2): 100 ug via INTRAVENOUS
  Filled 2023-08-22: qty 2
  Filled 2023-08-22: qty 1
  Filled 2023-08-22: qty 2

## 2023-08-22 MED ORDER — PROSOURCE TF20 ENFIT COMPATIBL EN LIQD
60.0000 mL | Freq: Every day | ENTERAL | Status: DC
  Administered 2023-08-22 – 2023-08-25 (×4): 60 mL
  Filled 2023-08-22 (×5): qty 60
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
## 2023-08-22 NOTE — Progress Notes (Signed)
Patient's RN informed stopped vasopressors. No need USGPIV access at this time. HS [employer]

## 2023-08-22 NOTE — Progress Notes (Signed)
  Echocardiogram 2D Echocardiogram has been performed.  Delcie Roch 08/22/2023, 4:52 PM
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
## 2023-08-22 NOTE — Progress Notes (Signed)
Pt transported from TRA C to 4N31 without any complications.
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
## 2023-08-22 NOTE — Procedures (Signed)
Central Venous Catheter Insertion Procedure Note  John Ochoa  [id number]  07/18/1946  Date:08/22/23  Time:2:56 PM   Provider Performing:Alira Fretwell Celine Mans   Procedure: Insertion of Non-tunneled Central Venous Catheter(36556) without US guidance  Indication(s) Medication administration and Difficult access  Consent Unable to obtain consent due to emergent nature of procedure.  Anesthesia Topical only with 1% lidocaine   Timeout Verified patient identification, verified procedure, site/side was marked, verified correct patient position, special equipment/implants available, medications/allergies/relevant history reviewed, required imaging and test results available.  Sterile Technique Maximal sterile technique including full sterile barrier drape, hand hygiene, sterile gown, sterile gloves, mask, hair covering, sterile ultrasound probe cover (if used).  Procedure Description Area of catheter insertion was cleaned with chlorhexidine and draped in sterile fashion.  Without real-time ultrasound guidance a central venous catheter was placed into the left subclavian vein. Nonpulsatile blood flow and easy flushing noted in all ports.  The catheter was sutured in place and sterile dressing applied.  Complications/Tolerance None; patient tolerated the procedure well. Chest X-ray is ordered to verify placement for internal jugular or subclavian cannulation.   Chest x-ray is not ordered for femoral cannulation.  EBL Minimal  Specimen(s) None   John Guys, PA - [facility] For pager details, please see AMION or use Epic chat  After 1900, please call ELINK for cross coverage needs 08/22/2023, 2:56 PM
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
## 2023-08-22 NOTE — Progress Notes (Signed)
LTM EEG hooked up and running - no initial skin breakdown - push button tested. MRI safe leads used
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
## 2023-08-22 NOTE — Progress Notes (Signed)
STAT EEG complete - results pending. ? ?

## 2023-08-22 NOTE — ED Notes (Signed)
Pt had 2 large loose bowel movements, RN and TRN cleaned pt.

## 2023-08-22 NOTE — Progress Notes (Signed)
Pt transported to CT and back to TRA C  without any complications.  

## 2023-08-22 NOTE — ED Notes (Signed)
CCM at bedside doing a central line. RT at bedside doing arterial line.
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
## 2023-08-22 NOTE — Procedures (Signed)
Arterial Catheter Insertion Procedure Note  John Ochoa  [id number]  17-Aug-1946  Date:08/22/23  Time:3:15 PM    Provider Performing: Vicente Masson    Procedure: Insertion of Arterial Line (40102) without US guidance  Indication(s) Blood pressure monitoring and/or need for frequent ABGs  Consent Unable to obtain consent due to inability to find a medical decision maker for patient.  All reasonable efforts were made.  Another independent medical provider, Dr. Craige Cotta , confirmed the benefits of this procedure outweigh the risks.  Anesthesia None   Time Out Verified patient identification, verified procedure, site/side was marked, verified correct patient position, special equipment/implants available, medications/allergies/relevant history reviewed, required imaging and test results available.   Sterile Technique Maximal sterile technique including full sterile barrier drape, hand hygiene, sterile gown, sterile gloves, mask, hair covering, sterile ultrasound probe cover (if used).   Procedure Description Area of catheter insertion was cleaned with chlorhexidine and draped in sterile fashion. Without real-time ultrasound guidance an arterial catheter was placed into the right radial artery.  Appropriate arterial tracings confirmed on monitor.     Complications/Tolerance None; patient tolerated the procedure well.   EBL Minimal   Specimen(s) None
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
## 2023-08-23 ENCOUNTER — Inpatient Hospital Stay (HOSPITAL_COMMUNITY): Payer: Medicare Other

## 2023-08-23 DIAGNOSIS — R569 Unspecified convulsions: Secondary | ICD-10-CM | POA: Diagnosis not present

## 2023-08-23 DIAGNOSIS — G40409 Other generalized epilepsy and epileptic syndromes, not intractable, without status epilepticus: Secondary | ICD-10-CM | POA: Diagnosis not present

## 2023-08-23 DIAGNOSIS — I469 Cardiac arrest, cause unspecified: Secondary | ICD-10-CM | POA: Diagnosis not present

## 2023-08-23 LAB — POCT I-STAT 7, (LYTES, BLD GAS, ICA,H+H)
Acid-base deficit: 7 mmol/L — ABNORMAL HIGH (ref 0.0–2.0)
Bicarbonate: 18.1 mmol/L — ABNORMAL LOW (ref 20.0–28.0)
Calcium, Ion: 1.06 mmol/L — ABNORMAL LOW (ref 1.15–1.40)
HCT: 38 % — ABNORMAL LOW (ref 39.0–52.0)
Hemoglobin: 12.9 g/dL — ABNORMAL LOW (ref 13.0–17.0)
O2 Saturation: 99 %
Patient temperature: 37.1
Potassium: 2.9 mmol/L — ABNORMAL LOW (ref 3.5–5.1)
Sodium: 135 mmol/L (ref 135–145)
TCO2: 19 mmol/L — ABNORMAL LOW (ref 22–32)
pCO2 arterial: 35.7 mmHg (ref 32–48)
pH, Arterial: 7.314 — ABNORMAL LOW (ref 7.35–7.45)
pO2, Arterial: 126 mmHg — ABNORMAL HIGH (ref 83–108)

## 2023-08-23 LAB — LACTIC ACID, PLASMA: Lactic Acid, Venous: 2.8 mmol/L (ref 0.5–1.9)

## 2023-08-23 LAB — GLUCOSE, CAPILLARY
Glucose-Capillary: 169 mg/dL — ABNORMAL HIGH (ref 70–99)
Glucose-Capillary: 156 mg/dL — ABNORMAL HIGH (ref 70–99)
Glucose-Capillary: 156 mg/dL — ABNORMAL HIGH (ref 70–99)
Glucose-Capillary: 150 mg/dL — ABNORMAL HIGH (ref 70–99)
Glucose-Capillary: 118 mg/dL — ABNORMAL HIGH (ref 70–99)

## 2023-08-23 LAB — BASIC METABOLIC PANEL
Anion gap: 16 — ABNORMAL HIGH (ref 5–15)
BUN: 31 mg/dL — ABNORMAL HIGH (ref 8–23)
CO2: 19 mmol/L — ABNORMAL LOW (ref 22–32)
Calcium: 7.7 mg/dL — ABNORMAL LOW (ref 8.9–10.3)
Chloride: 97 mmol/L — ABNORMAL LOW (ref 98–111)
Creatinine, Ser: 1.92 mg/dL — ABNORMAL HIGH (ref 0.61–1.24)
GFR, Estimated: 36 mL/min — ABNORMAL LOW (ref >60)
Glucose, Bld: 169 mg/dL — ABNORMAL HIGH (ref 70–99)
Potassium: 3.2 mmol/L — ABNORMAL LOW (ref 3.5–5.1)
Sodium: 132 mmol/L — ABNORMAL LOW (ref 135–145)

## 2023-08-23 LAB — MAGNESIUM: Magnesium: 1.5 mg/dL — ABNORMAL LOW (ref 1.7–2.4)

## 2023-08-23 LAB — CBC
HCT: 38.4 % — ABNORMAL LOW (ref 39.0–52.0)
Hemoglobin: 13.1 g/dL (ref 13.0–17.0)
MCH: 28.7 pg (ref 26.0–34.0)
MCHC: 34.1 g/dL (ref 30.0–36.0)
MCV: 84 fL (ref 80.0–100.0)
Platelets: 156 10*3/uL (ref 150–400)
RBC: 4.57 MIL/uL (ref 4.22–5.81)
RDW: 12 % (ref 11.5–15.5)
WBC: 19.9 10*3/uL — ABNORMAL HIGH (ref 4.0–10.5)
nRBC: 0 % (ref 0.0–0.2)

## 2023-08-23 LAB — TRIGLYCERIDES: Triglycerides: 100 mg/dL (ref <150)

## 2023-08-23 LAB — PHOSPHORUS: Phosphorus: 3.4 mg/dL (ref 2.5–4.6)

## 2023-08-23 MED ORDER — POTASSIUM CHLORIDE 10 MEQ/50ML IV SOLN
10.0000 meq | INTRAVENOUS | Status: AC
  Administered 2023-08-23 (×4): 10 meq via INTRAVENOUS
  Filled 2023-08-23 (×4): qty 50

## 2023-08-23 MED ORDER — PIVOT 1.5 CAL PO LIQD
1000.0000 mL | ORAL | Status: DC
  Administered 2023-08-24: 1000 mL

## 2023-08-23 MED ORDER — ADULT MULTIVITAMIN W/MINERALS CH
1.0000 | ORAL_TABLET | Freq: Every day | ORAL | Status: DC
  Administered 2023-08-23 – 2023-08-25 (×3): 1
  Filled 2023-08-23 (×4): qty 1

## 2023-08-23 MED ORDER — SODIUM CHLORIDE 0.9 % IV SOLN: INTRAVENOUS | Status: DC | PRN

## 2023-08-23 MED ORDER — POTASSIUM CHLORIDE 20 MEQ PO PACK
20.0000 meq | PACK | ORAL | Status: AC
  Administered 2023-08-23 (×2): 20 meq
  Filled 2023-08-23 (×2): qty 1

## 2023-08-23 MED ORDER — MAGNESIUM SULFATE 4 GM/100ML IV SOLN
4.0000 g | Freq: Once | INTRAVENOUS | Status: AC
  Administered 2023-08-23: 4 g via INTRAVENOUS
  Filled 2023-08-23: qty 100

## 2023-08-23 MED ORDER — DEXMEDETOMIDINE HCL IN NACL 400 MCG/100ML IV SOLN
0.0000 ug/kg/h | INTRAVENOUS | Status: DC
  Administered 2023-08-23: 0.4 ug/kg/h via INTRAVENOUS
  Administered 2023-08-24: 0.6 ug/kg/h via INTRAVENOUS
  Filled 2023-08-23 (×2): qty 100

## 2023-08-23 NOTE — Progress Notes (Addendum)
eLink Physician-Brief Progress Note Patient Name: John Ochoa DOB: [birth year] MRN: [id number]   Date of Service  08/23/2023  HPI/Events of Note  77 year old male that initially had bowel movement earlier in the day that was loose and concerning for melena.  Had no persistent bowel movements and no recurrence.  Flexi-Seal was placed at that time.  eICU Interventions  For now, can defer stool testing.  Slow downtrend in hemoglobin from 15.3->12.9.  Remove Flexi-Seal and continue observation.   0522 - LA improving to 2.8, continue observation  Intervention Category Minor Interventions: Routine modifications to care plan (e.g. PRN medications for pain, fever)  John Ochoa 08/23/2023, 4:56 AM

## 2023-08-23 NOTE — Progress Notes (Signed)
[facility] ADULT ICU REPLACEMENT PROTOCOL   The patient does apply for the [facility] Adult ICU Electrolyte Replacment Protocol based on the criteria listed below:   1.Exclusion criteria: TCTS, ECMO, Dialysis, and Myasthenia Gravis patients 2. Is GFR >/= 30 ml/min? Yes.    Patient's GFR today is 36 3. Is SCr </= 2? Yes.   Patient's SCr is 1.92 mg/dL 4. Did SCr increase >/= 0.5 in 24 hours? No. 5.Pt's weight >40kg  Yes.   6. Abnormal electrolyte(s): K+=3.2, Mg = 1.5  7. Electrolytes replaced per protocol 8.  Call MD STAT for K+ </= 2.5, Phos </= 1, or Mag </= 1 Physician:  Delia Chimes, eMD  Suzan Slick Anaiah Mcmannis 08/23/2023 6:07 AM
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
## 2023-08-23 NOTE — TOC CAGE-AID Note (Signed)
Transition of Care [facility]) - CAGE-AID Screening   Patient Details  Name: John Ochoa MRN: [id number] Date of Birth: [birth year]  Transition of Care [facility]) CM/SW Contact:    Leota Sauers, RN Phone Number: 08/23/2023, 3:46 AM   Clinical Narrative:  Patient unable to participate due to current intubation.  CAGE-AID Screening: Substance Abuse Screening unable to be completed due to: : Patient unable to participate

## 2023-08-23 NOTE — Progress Notes (Signed)
eLink Physician-Brief Progress Note Patient Name: John Ochoa DOB: 06-06-1946 MRN: [id number]   Date of Service  08/23/2023  HPI/Events of Note  Large amounts of liquid brown stool, no melena, Flexi-Seal removed yesterday  eICU Interventions  Replace Flexi-Seal     Intervention Category Minor Interventions: Routine modifications to care plan (e.g. PRN medications for pain, fever)  Sheron Tallman 08/23/2023, 10:30 PM
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
## 2023-08-24 ENCOUNTER — Inpatient Hospital Stay (HOSPITAL_COMMUNITY): Payer: Medicare Other

## 2023-08-24 DIAGNOSIS — G931 Anoxic brain damage, not elsewhere classified: Secondary | ICD-10-CM

## 2023-08-24 DIAGNOSIS — E43 Unspecified severe protein-calorie malnutrition: Secondary | ICD-10-CM | POA: Insufficient documentation

## 2023-08-24 DIAGNOSIS — R569 Unspecified convulsions: Secondary | ICD-10-CM | POA: Diagnosis not present

## 2023-08-24 DIAGNOSIS — I469 Cardiac arrest, cause unspecified: Secondary | ICD-10-CM | POA: Diagnosis not present

## 2023-08-24 DIAGNOSIS — G40409 Other generalized epilepsy and epileptic syndromes, not intractable, without status epilepticus: Secondary | ICD-10-CM | POA: Diagnosis not present

## 2023-08-24 LAB — BASIC METABOLIC PANEL
Anion gap: 9 (ref 5–15)
BUN: 39 mg/dL — ABNORMAL HIGH (ref 8–23)
CO2: 20 mmol/L — ABNORMAL LOW (ref 22–32)
Calcium: 7.1 mg/dL — ABNORMAL LOW (ref 8.9–10.3)
Chloride: 103 mmol/L (ref 98–111)
Creatinine, Ser: 1.7 mg/dL — ABNORMAL HIGH (ref 0.61–1.24)
GFR, Estimated: 41 mL/min — ABNORMAL LOW (ref >60)
Glucose, Bld: 141 mg/dL — ABNORMAL HIGH (ref 70–99)
Potassium: 3.1 mmol/L — ABNORMAL LOW (ref 3.5–5.1)
Sodium: 132 mmol/L — ABNORMAL LOW (ref 135–145)

## 2023-08-24 LAB — GLUCOSE, CAPILLARY
Glucose-Capillary: 103 mg/dL — ABNORMAL HIGH (ref 70–99)
Glucose-Capillary: 113 mg/dL — ABNORMAL HIGH (ref 70–99)
Glucose-Capillary: 132 mg/dL — ABNORMAL HIGH (ref 70–99)
Glucose-Capillary: 147 mg/dL — ABNORMAL HIGH (ref 70–99)
Glucose-Capillary: 155 mg/dL — ABNORMAL HIGH (ref 70–99)
Glucose-Capillary: 85 mg/dL (ref 70–99)
Glucose-Capillary: 98 mg/dL (ref 70–99)

## 2023-08-24 LAB — PHOSPHORUS
Phosphorus: 1.5 mg/dL — ABNORMAL LOW (ref 2.5–4.6)
Phosphorus: 2.6 mg/dL (ref 2.5–4.6)

## 2023-08-24 LAB — TSH: TSH: 0.216 u[IU]/mL — ABNORMAL LOW (ref 0.350–4.500)

## 2023-08-24 LAB — MAGNESIUM: Magnesium: 2.3 mg/dL (ref 1.7–2.4)

## 2023-08-24 MED ORDER — LEVOTHYROXINE SODIUM 75 MCG PO TABS
75.0000 ug | ORAL_TABLET | Freq: Every day | ORAL | Status: DC
  Administered 2023-08-24 – 2023-08-26 (×3): 75 ug
  Filled 2023-08-24 (×3): qty 1

## 2023-08-24 MED ORDER — POTASSIUM PHOSPHATES 15 MMOLE/5ML IV SOLN
45.0000 mmol | Freq: Once | INTRAVENOUS | Status: AC
  Administered 2023-08-24: 45 mmol via INTRAVENOUS
  Filled 2023-08-24: qty 15

## 2023-08-24 MED ORDER — SODIUM CHLORIDE 0.9 % IV SOLN
3.0000 g | Freq: Four times a day (QID) | INTRAVENOUS | Status: DC
  Administered 2023-08-24 – 2023-08-26 (×8): 3 g via INTRAVENOUS
  Filled 2023-08-24 (×8): qty 8

## 2023-08-24 MED ORDER — PIVOT 1.5 CAL PO LIQD
1000.0000 mL | ORAL | Status: DC
  Administered 2023-08-24 – 2023-08-26 (×5): 1000 mL

## 2023-08-24 MED ORDER — THIAMINE MONONITRATE 100 MG PO TABS
100.0000 mg | ORAL_TABLET | Freq: Every day | ORAL | Status: DC
  Administered 2023-08-24 – 2023-08-25 (×2): 100 mg
  Filled 2023-08-24 (×3): qty 1
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
## 2023-08-24 NOTE — Progress Notes (Signed)
 Maint done, no skin breakdown

## 2023-08-24 NOTE — Progress Notes (Signed)
LTM EEG D/C'd. [facility] notified. No noted skin break down.

## 2023-08-24 NOTE — Procedures (Signed)
Cortrak  Person Inserting Tube:  Pitts, Heather C, RD Tube Type:  Cortrak - 43 inches Tube Size:  10 Tube Location:  Left nare Secured by: Bridle Technique Used to Measure Tube Placement:  Marking at nare/corner of mouth Cortrak Secured At:  71 cm   Cortrak Tube Team Note:  Consult received to place a Cortrak feeding tube.   X-ray is required, abdominal x-ray has been ordered by the Cortrak team. Please confirm tube placement before using the Cortrak tube.   If the tube becomes dislodged please keep the tube and contact the Cortrak team at [email or web address] for replacement.  If after hours and replacement cannot be delayed, place a NG tube and confirm placement with an abdominal x-ray.    Heather P., RD, LDN, CNSC See AMiON for contact information    

## 2023-08-24 NOTE — Progress Notes (Signed)
[facility] ADULT ICU REPLACEMENT PROTOCOL   The patient does apply for the [facility] Adult ICU Electrolyte Replacment Protocol based on the criteria listed below:   1.Exclusion criteria: TCTS, ECMO, Dialysis, and Myasthenia Gravis patients 2. Is GFR >/= 30 ml/min? Yes.    Patient's GFR today is 41  3. Is SCr </= 2? Yes.   Patient's SCr is 1.70  mg/dL 4. Did SCr increase >/= 0.5 in 24 hours? No. 5.Pt's weight >40kg  Yes.   6. Abnormal electrolyte(s): K+ = 3.1, Phos = 1.5  7. Electrolytes replaced per protocol 8.  Call MD STAT for K+ </= 2.5, Phos </= 1, or Mag </= 1 Physician:  Delia Chimes, eMD  John Ochoa 08/24/2023 6:07 AM

## 2023-08-24 NOTE — Progress Notes (Addendum)
Brief Nutrition Support Note  Pt remains intubated on ventilator support. New order for cortrak tube placed this AM. TF previously infusing at goal rate. Pt is showing signs of refeeding but replacements are ordered.  Discussed with RN. Cortrak terminates in the pyloric region and TF to be resumed. Previous regimen is appropriate to be resumed. Will start to 30 and increase by 10 q6h to goal to allow time for electrolytes to stabilize   Tube feeding via cortrak tube: Pivot 1.5 at 50 ml/hr  (1200 ml per day) Restart at 30 and advanc eby 10 q6h to goal of 50 Provides 1800 kcal, 112 gm protein, 912 ml free water daily  Add thiamine 100mg  x 5 days for refeeding   Greig Castilla, RD, LDN Clinical Dietitian RD pager # available in AMION  After hours/weekend pager # available in [facility]

## 2023-08-24 NOTE — Progress Notes (Signed)
Pt was placed on wean 10/5 40%. Pt did not tolerate with increase RR > 47 and increase agitation. Pt flipped back to full support. RT will continue to monitor.   08/24/23 0821  Daily Weaning Assessment  Daily Assessment of Readiness to Wean Wean protocol criteria met (SBT performed)  SBT Method CPAP 5 cm H20 and PS 5 cm H20  Weaning Start Time [id number]
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
## 2023-08-24 NOTE — Progress Notes (Addendum)
eLink Physician-Brief Progress Note Patient Name: John Ochoa DOB: 12/04/1946 MRN: [id number]   Date of Service  08/24/2023  HPI/Events of Note  Notified of hypertension with arterial line SBP in the 170s-190s vs cuff pressure of SBP 130s.  Pt is off sedation but is overbreathing the vent with RR at 30.   eICU Interventions  Give fentanyl bolus.  Trend cuff pressures.      Intervention Category Intermediate Interventions: Hypertension - evaluation and management  Larinda Buttery 08/24/2023, 11:04 PM  11:55 PM RR improved after fentanyl bolus.  SBP improved on the arterial line down to 160s after fentanyl bolus.  Effect only lasted about 20 minutes.   Plan> Get ABG, lactic acid and BMP.  Will consider starting the patient on precedex gtt.   12:35 AM ABG 7.37/38.9/70.  RR reaching into the 40s.  Arterial line BP also even higher in the 200s.   Plan> Start on precedex gtt and give fentanyl PRN.  2:09 AM Pt reassessed and SBP on the arterial line in the 130s, cuff BP at 110s.    Plan> Continue to monitor.   3:50 AM Notified of new onset atrial fibrillation with rate in the 90s-110s.  BP 112/74 cuff pressure and arterial BP 136/59.   Plan> Continue to monitor for now.  Will defer decision on anticoagulation to the rounding team.

## 2023-08-25 ENCOUNTER — Inpatient Hospital Stay (HOSPITAL_COMMUNITY): Payer: Medicare Other

## 2023-08-25 DIAGNOSIS — G931 Anoxic brain damage, not elsewhere classified: Secondary | ICD-10-CM | POA: Diagnosis not present

## 2023-08-25 DIAGNOSIS — I469 Cardiac arrest, cause unspecified: Secondary | ICD-10-CM | POA: Diagnosis not present

## 2023-08-25 DIAGNOSIS — N179 Acute kidney failure, unspecified: Secondary | ICD-10-CM

## 2023-08-25 DIAGNOSIS — J9601 Acute respiratory failure with hypoxia: Secondary | ICD-10-CM | POA: Diagnosis not present

## 2023-08-25 LAB — POCT I-STAT 7, (LYTES, BLD GAS, ICA,H+H)
Acid-base deficit: 2 mmol/L (ref 0.0–2.0)
Bicarbonate: 22.7 mmol/L (ref 20.0–28.0)
Calcium, Ion: 1.13 mmol/L — ABNORMAL LOW (ref 1.15–1.40)
HCT: 32 % — ABNORMAL LOW (ref 39.0–52.0)
Hemoglobin: 10.9 g/dL — ABNORMAL LOW (ref 13.0–17.0)
O2 Saturation: 93 %
Patient temperature: 99.9
Potassium: 3.5 mmol/L (ref 3.5–5.1)
Sodium: 137 mmol/L (ref 135–145)
TCO2: 24 mmol/L (ref 22–32)
pCO2 arterial: 38.9 mmHg (ref 32–48)
pH, Arterial: 7.377 (ref 7.35–7.45)
pO2, Arterial: 70 mmHg — ABNORMAL LOW (ref 83–108)

## 2023-08-25 LAB — BASIC METABOLIC PANEL
Anion gap: 12 (ref 5–15)
BUN: 44 mg/dL — ABNORMAL HIGH (ref 8–23)
CO2: 20 mmol/L — ABNORMAL LOW (ref 22–32)
Calcium: 7.6 mg/dL — ABNORMAL LOW (ref 8.9–10.3)
Chloride: 103 mmol/L (ref 98–111)
Creatinine, Ser: 1.55 mg/dL — ABNORMAL HIGH (ref 0.61–1.24)
GFR, Estimated: 46 mL/min — ABNORMAL LOW (ref >60)
Glucose, Bld: 149 mg/dL — ABNORMAL HIGH (ref 70–99)
Potassium: 3.4 mmol/L — ABNORMAL LOW (ref 3.5–5.1)
Sodium: 135 mmol/L (ref 135–145)

## 2023-08-25 LAB — GLUCOSE, CAPILLARY
Glucose-Capillary: 156 mg/dL — ABNORMAL HIGH (ref 70–99)
Glucose-Capillary: 140 mg/dL — ABNORMAL HIGH (ref 70–99)
Glucose-Capillary: 158 mg/dL — ABNORMAL HIGH (ref 70–99)
Glucose-Capillary: 141 mg/dL — ABNORMAL HIGH (ref 70–99)
Glucose-Capillary: 169 mg/dL — ABNORMAL HIGH (ref 70–99)

## 2023-08-25 LAB — LACTIC ACID, PLASMA: Lactic Acid, Venous: 1.1 mmol/L (ref 0.5–1.9)

## 2023-08-25 MED ORDER — POTASSIUM CHLORIDE 10 MEQ/50ML IV SOLN
10.0000 meq | INTRAVENOUS | Status: AC
  Administered 2023-08-25 (×3): 10 meq via INTRAVENOUS
  Filled 2023-08-25: qty 50

## 2023-08-25 MED ORDER — CALCIUM GLUCONATE-NACL 1-0.675 GM/50ML-% IV SOLN
1.0000 g | Freq: Once | INTRAVENOUS | Status: AC
  Administered 2023-08-25: 1000 mg via INTRAVENOUS
  Filled 2023-08-25: qty 50

## 2023-08-25 MED ORDER — DEXMEDETOMIDINE HCL IN NACL 400 MCG/100ML IV SOLN
0.0000 ug/kg/h | INTRAVENOUS | Status: DC
  Administered 2023-08-25: 0.4 ug/kg/h via INTRAVENOUS
  Administered 2023-08-25: 0.8 ug/kg/h via INTRAVENOUS
  Administered 2023-08-26: 0.6 ug/kg/h via INTRAVENOUS
  Filled 2023-08-25 (×4): qty 100
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
## 2023-08-25 NOTE — Progress Notes (Signed)
Patient transported to MRI and back to 4N31.

## 2023-08-25 NOTE — IPAL (Signed)
Interdisciplinary Goals of Care Family Meeting   Date carried out:: 08/25/2023  Location of the meeting: Bedside  Member's involved: Physician, Bedside Registered Nurse, and Family Member or next of kin  Durable Power of Attorney or acting medical decision maker: Holly Bodily    Discussion: We discussed goals of care for [employer] .    The Clinical status was relayed to patient's wife and Son at bedside in detail.   Updated and notified of patients medical condition.     Patient remains unresponsive and will not open eyes to command.  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  Code status: Full DNR  Disposition: Continue current acute care for now, until family comes and say good bye, before proceeding with comfort care.    Family are satisfied with Plan of action and management. All questions answered   Cheri Fowler MD [facility] See Amion for pager If no response to pager, please call [phone] until 7pm After 7pm, Please call E-link [phone]

## 2023-08-26 DIAGNOSIS — G931 Anoxic brain damage, not elsewhere classified: Secondary | ICD-10-CM | POA: Diagnosis not present

## 2023-08-26 DIAGNOSIS — E43 Unspecified severe protein-calorie malnutrition: Secondary | ICD-10-CM | POA: Diagnosis not present

## 2023-08-26 DIAGNOSIS — I469 Cardiac arrest, cause unspecified: Secondary | ICD-10-CM | POA: Diagnosis not present

## 2023-08-26 LAB — GLUCOSE, CAPILLARY
Glucose-Capillary: 122 mg/dL — ABNORMAL HIGH (ref 70–99)
Glucose-Capillary: 140 mg/dL — ABNORMAL HIGH (ref 70–99)
Glucose-Capillary: 149 mg/dL — ABNORMAL HIGH (ref 70–99)

## 2023-08-26 MED ORDER — GLYCOPYRROLATE 0.2 MG/ML IJ SOLN
0.2000 mg | INTRAMUSCULAR | Status: DC | PRN
  Administered 2023-08-26 (×2): 0.2 mg via INTRAVENOUS
  Filled 2023-08-26 (×2): qty 1

## 2023-08-26 MED ORDER — FENTANYL 2500MCG IN NS 250ML (10MCG/ML) PREMIX INFUSION
0.0000 ug/h | INTRAVENOUS | Status: DC
  Administered 2023-08-26: 50 ug/h via INTRAVENOUS
  Administered 2023-08-26: 600 ug/h via INTRAVENOUS
  Filled 2023-08-26 (×2): qty 250

## 2023-08-26 MED ORDER — GLYCOPYRROLATE 1 MG PO TABS: 1.0000 mg | ORAL_TABLET | ORAL | Status: DC | PRN

## 2023-08-26 MED ORDER — MIDAZOLAM-SODIUM CHLORIDE 100-0.9 MG/100ML-% IV SOLN
0.5000 mg/h | INTRAVENOUS | Status: DC
  Administered 2023-08-26: 0.5 mg/h via INTRAVENOUS
  Filled 2023-08-26: qty 100

## 2023-08-26 MED ORDER — MIDAZOLAM HCL 2 MG/2ML IJ SOLN
INTRAMUSCULAR | Status: AC
  Administered 2023-08-26: 2 mg via INTRAVENOUS
  Filled 2023-08-26: qty 2

## 2023-08-26 MED ORDER — FENTANYL BOLUS VIA INFUSION
100.0000 ug | INTRAVENOUS | Status: DC | PRN
  Administered 2023-08-26: 100 ug via INTRAVENOUS

## 2023-08-26 MED ORDER — POLYVINYL ALCOHOL 1.4 % OP SOLN: 1.0000 [drp] | Freq: Four times a day (QID) | OPHTHALMIC | Status: DC | PRN

## 2023-08-26 MED ORDER — MIDAZOLAM HCL 2 MG/2ML IJ SOLN: 2.0000 mg | Freq: Once | INTRAMUSCULAR | Status: AC

## 2023-08-26 MED ORDER — GLYCOPYRROLATE 0.2 MG/ML IJ SOLN: 0.2000 mg | INTRAMUSCULAR | Status: DC | PRN

## 2023-08-26 DEATH — deceased

## 2023-08-27 LAB — CULTURE, BLOOD (ROUTINE X 2)
Culture: NO GROWTH
Culture: NO GROWTH
Special Requests: ADEQUATE
Special Requests: ADEQUATE

## 2023-08-28 ENCOUNTER — Encounter (HOSPITAL_COMMUNITY): Payer: Self-pay

## 2023-09-25 NOTE — Death Summary Note (Signed)
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 Electronically Signed   By: Tiburcio Pea M.D.   On: 08/07/2023 12:31    Microbiology Recent Results (from the past 240 hour(s))  MRSA Next Gen by PCR, Nasal     Status: None   Collection Time: 08/15/2023  4:36 PM   Specimen: Nasal Mucosa; Nasal Swab  Result Value Ref Range Status   MRSA by PCR Next Gen NOT DETECTED NOT DETECTED Final    Comment: (NOTE) The GeneXpert MRSA Assay (FDA approved for NASAL specimens only), is one component of a comprehensive MRSA colonization surveillance program. It is not intended to diagnose MRSA infection nor to guide or monitor treatment for MRSA infections. Test performance is not FDA approved in patients less than 30 years old. Performed at [facility], [address], [place], [place] [address]   Culture, blood (Routine X 2) w Reflex to ID Panel     Status: None   Collection Time: 08/04/2023 10:00 PM   Specimen: BLOOD RIGHT HAND  Result Value Ref Range Status   Specimen Description BLOOD RIGHT HAND  Final   Special Requests   Final    BOTTLES DRAWN AEROBIC AND ANAEROBIC Blood Culture adequate volume   Culture   Final    NO GROWTH 5 DAYS Performed at [facility], [address] [place], [place] [address]    Report Status 08/27/2023 FINAL  Final  Culture, blood (Routine X 2) w Reflex to  ID Panel     Status: None   Collection Time: 07/30/2023 10:04 PM   Specimen: BLOOD RIGHT ARM  Result Value Ref Range Status   Specimen Description BLOOD RIGHT ARM  Final   Special Requests   Final    BOTTLES DRAWN AEROBIC AND ANAEROBIC Blood Culture adequate volume   Culture   Final    NO GROWTH 5 DAYS Performed at [facility], [address], [place], [place] [address]    Report  Status 08/27/2023 FINAL  Final    Lab Basic Metabolic Panel: Recent Labs  Lab 08/16/2023 1207 08/01/2023 1337 08/23/23 0231 08/23/23 0433 08/24/23 0514 08/24/23 2014 08/25/23 0009 08/25/23 0010  NA 134*   < > 135 132* 132*  --  137 135  K 3.3*   < > 2.9* 3.2* 3.1*  --  3.5 3.4*  CL 95*  --   --  97* 103  --   --  103  CO2 19*  --   --  19* 20*  --   --  20*  GLUCOSE 212*  --   --  169* 141*  --   --  149*  BUN 23  --   --  31* 39*  --   --  44*  CREATININE 1.80*  --   --  1.92* 1.70*  --   --  1.55*  CALCIUM 8.3*  --   --  7.7* 7.1*  --   --  7.6*  MG 2.1  --   --  1.5* 2.3  --   --   --   PHOS 6.1*  --   --  3.4 1.5* 2.6  --   --    < > = values in this interval not displayed.   Liver Function Tests: Recent Labs  Lab 07/26/2023 1207  AST 47*  ALT 28  ALKPHOS 65  BILITOT 1.3*  PROT 5.5*  ALBUMIN 2.9*   No results for input(s): "LIPASE", "AMYLASE" in the last 168 hours. No results for input(s): "AMMONIA" in the last 168 hours. CBC: Recent Labs  Lab 08/21/2023 1207 08/16/2023 1337 08/08/2023 1429 08/23/23 0231 08/23/23 0433 08/25/23 0009  WBC 14.9*  --   --   --  19.9*  --   NEUTROABS 12.2*  --   --   --   --   --   HGB 14.8 15.3 13.6 12.9* 13.1 10.9*  HCT 45.0 45.0 40.0 38.0* 38.4* 32.0*  MCV 90.4  --   --   --  84.0  --   PLT 158  --   --   --  156  --    Cardiac Enzymes: Recent Labs  Lab 08/21/2023 1207  CKTOTAL 183  CKMB 3.9   Sepsis Labs: Recent Labs  Lab 08/24/2023 1207 08/01/2023 1316 08/23/23 0433 08/25/23 0010  WBC 14.9*  --  19.9*  --   LATICACIDVEN  --  7.8* 2.8*  1.1    Procedures/Operations     Kamaile Zachow 08/27/2023, 1:20 PM

## 2023-09-25 NOTE — Progress Notes (Signed)
   09/09/2023 1300  Spiritual Encounters  Type of Visit Initial  Care provided to: [employer] partners present during encounter Nurse  Referral source Nurse (RN/NT/LPN)  Reason for visit End-of-life  OnCall Visit Yes   Ch responded to request for emotional and spiritual support. Pt's family was present at bedside. Ch sat with family and pt and supported them through the grief process. Ch supported family by providing compassionate and a calming presence. Ch remains available, if needed.    Chaplain Val Raeden Schippers, M.Div.

## 2023-09-25 NOTE — Progress Notes (Signed)
RT compassionately extubated 4N31 per MD order and family wishes, with RN at bedside.
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
## 2023-09-25 NOTE — Progress Notes (Signed)
ETT was removed at 1230. Family at the bedside with the pt. Chaplin called and came by to provide support. Comfort care cart was ordered and at the bedside. Fentanyl gtt was started, but was increased to max dose and adding Versed gtt due to pt discomfort (HR in 1540s, RR in 30-40-s. Multiple boluses were give until pt was more comfortable.  1631 Asystole was noted. 2 RNs at the bedside to pronounce TID. Emotional support was provided to the family.   After familyl eft, [facility] called and notified. With possibility of tissue donation eyes were prepared. IVs, Foley , Cortrak and Flexy seal were removed. Pt was cleaned, and ready for morgue.  Rest of Fentanyl and Versed gtt were wasted with RN in the Pyxis.

## 2023-09-25 DEATH — deceased
